# Patient Record
Sex: Male | Born: 1935 | Race: White | Hispanic: No | Smoking: Former smoker
Health system: Southern US, Community
[De-identification: ages and names within clinical notes are randomized; demographics above are authoritative.]

## PROBLEM LIST (undated history)

## (undated) DIAGNOSIS — R609 Edema, unspecified: Secondary | ICD-10-CM

## (undated) DIAGNOSIS — H9190 Unspecified hearing loss, unspecified ear: Secondary | ICD-10-CM

## (undated) DIAGNOSIS — J439 Emphysema, unspecified: Secondary | ICD-10-CM

## (undated) DIAGNOSIS — M1990 Unspecified osteoarthritis, unspecified site: Secondary | ICD-10-CM

## (undated) DIAGNOSIS — K219 Gastro-esophageal reflux disease without esophagitis: Secondary | ICD-10-CM

## (undated) DIAGNOSIS — R0600 Dyspnea, unspecified: Secondary | ICD-10-CM

## (undated) HISTORY — PX: CARDIAC CATHETERIZATION: SHX172

## (undated) HISTORY — PX: APPENDECTOMY: SHX54

## (undated) HISTORY — PX: CHOLECYSTECTOMY OPEN: SUR202

---

## 1999-06-09 ENCOUNTER — Inpatient Hospital Stay (HOSPITAL_COMMUNITY): Admission: EM | Admit: 1999-06-09 | Discharge: 1999-06-10 | Payer: Self-pay | Admitting: *Deleted

## 1999-10-05 ENCOUNTER — Ambulatory Visit (HOSPITAL_COMMUNITY): Admission: RE | Admit: 1999-10-05 | Discharge: 1999-10-05 | Payer: Self-pay | Admitting: Gastroenterology

## 2001-11-12 ENCOUNTER — Emergency Department (HOSPITAL_COMMUNITY): Admission: EM | Admit: 2001-11-12 | Discharge: 2001-11-12 | Payer: Self-pay | Admitting: *Deleted

## 2001-11-12 ENCOUNTER — Encounter: Payer: Self-pay | Admitting: *Deleted

## 2007-09-11 ENCOUNTER — Inpatient Hospital Stay (HOSPITAL_COMMUNITY): Admission: EM | Admit: 2007-09-11 | Discharge: 2007-09-12 | Payer: Self-pay | Admitting: Emergency Medicine

## 2007-09-11 ENCOUNTER — Ambulatory Visit: Payer: Self-pay | Admitting: Internal Medicine

## 2010-05-29 ENCOUNTER — Inpatient Hospital Stay (HOSPITAL_COMMUNITY): Admission: EM | Admit: 2010-05-29 | Discharge: 2010-05-31 | Payer: Self-pay | Admitting: Emergency Medicine

## 2010-05-31 ENCOUNTER — Encounter (INDEPENDENT_AMBULATORY_CARE_PROVIDER_SITE_OTHER): Payer: Self-pay

## 2010-12-08 LAB — DIFFERENTIAL
Basophils Absolute: 0 10*3/uL (ref 0.0–0.1)
Eosinophils Absolute: 0.1 10*3/uL (ref 0.0–0.7)
Eosinophils Relative: 1 % (ref 0–5)
Neutrophils Relative %: 86 % — ABNORMAL HIGH (ref 43–77)

## 2010-12-08 LAB — URINALYSIS, ROUTINE W REFLEX MICROSCOPIC
Bilirubin Urine: NEGATIVE
Hgb urine dipstick: NEGATIVE
Nitrite: NEGATIVE
Specific Gravity, Urine: 1.014 (ref 1.005–1.030)
pH: 7.5 (ref 5.0–8.0)

## 2010-12-08 LAB — COMPREHENSIVE METABOLIC PANEL
AST: 25 U/L (ref 0–37)
CO2: 25 mEq/L (ref 19–32)
Chloride: 108 mEq/L (ref 96–112)
Creatinine, Ser: 0.91 mg/dL (ref 0.4–1.5)
GFR calc Af Amer: >60 mL/min (ref >60)
GFR calc non Af Amer: >60 mL/min (ref >60)
Total Bilirubin: 0.9 mg/dL (ref 0.3–1.2)

## 2010-12-08 LAB — LIPASE, BLOOD: Lipase: 30 U/L (ref 11–59)

## 2010-12-08 LAB — POCT CARDIAC MARKERS
CKMB, poc: <1 ng/mL — ABNORMAL LOW (ref 1.0–8.0)
CKMB, poc: <1 ng/mL — ABNORMAL LOW (ref 1.0–8.0)
Myoglobin, poc: 83.7 ng/mL (ref 12–200)

## 2010-12-08 LAB — BASIC METABOLIC PANEL
BUN: 14 mg/dL (ref 6–23)
CO2: 26 mEq/L (ref 19–32)
Calcium: 8.3 mg/dL — ABNORMAL LOW (ref 8.4–10.5)
Chloride: 109 mEq/L (ref 96–112)
Creatinine, Ser: 0.89 mg/dL (ref 0.4–1.5)
GFR calc Af Amer: >60 mL/min (ref >60)

## 2010-12-08 LAB — CBC
HCT: 42.7 % (ref 39.0–52.0)
Hemoglobin: 14.3 g/dL (ref 13.0–17.0)
MCH: 29.8 pg (ref 26.0–34.0)
MCH: 29.8 pg (ref 26.0–34.0)
MCV: 88.3 fL (ref 78.0–100.0)
MCV: 89 fL (ref 78.0–100.0)
Platelets: 174 10*3/uL (ref 150–400)
RBC: 4.8 MIL/uL (ref 4.22–5.81)
RDW: 14.5 % (ref 11.5–15.5)
WBC: 11.5 10*3/uL — ABNORMAL HIGH (ref 4.0–10.5)

## 2010-12-08 LAB — HEPATIC FUNCTION PANEL
Indirect Bilirubin: 0.4 mg/dL (ref 0.3–0.9)
Total Protein: 6 g/dL (ref 6.0–8.3)

## 2011-02-10 NOTE — Discharge Summary (Signed)
NAME:  Jesse Beltran, Jesse Beltran               ACCOUNT NO.:  [id number]   MEDICAL RECORD NO.:  [id number]          PATIENT TYPE:  INP   LOCATION:  5511                         FACILITY:  [facility]   PHYSICIAN:  Alvester Morin, M.D.  DATE OF BIRTH:  1933/06/20   DATE OF ADMISSION:  09/11/2007  DATE OF DISCHARGE:  09/12/2007                               DISCHARGE SUMMARY   DISCHARGE DIAGNOSES:  1. Pneumonia.  2. Hypokalemia.   DISCHARGE MEDICATIONS:  Avelox 400 mg p.o. daily x 5 days.   PROCEDURES PERFORMED:  1. Chest x-ray September 11, 2007 impression:  Focal opacity left mid      lung.  The patient has symptoms of infection, this finding is in      keeping with pneumonia.  Follow up radiographs to clearing are      recommended.  If pneumonia is not within the clinical differential,      CT is recommended for further evaluation.  2. CT scan of the chest with contrast September 11, 2007 impression:      Patchy airspace disease with central air bronchograms in the left      upper lobe, lingula and left lower lobe anteriorly suspicious for      acute left bronchial pneumonia.  Recommend follow up by plain      radiographs to document resolution.  No definite adenopathy by CT.      Tiny bilateral pulmonary nodules in the upper lobes as described.      This could be followed at 6 months to document stability.   FOLLOW UP:  The patient was instructed to follow up with Dr. Burnell Blanks.  During the visit, the patient should be assessed for  resolution of pneumonia including a CBC to assess white count.  Physical  examination obviously to assess air movement and chest x-ray to  determine resolution of airspace disease.   CONSULTATIONS:  No consultations were requested during the  hospitalization.   HISTORY/PHYSICAL:  Mr. Jesse Beltran is a 77 year old Caucasian male with  history of GERD and asthma that presents to the emergency department for  chest pain since the night prior to admission.  The patient  was in his  normal state of health until the day prior to admission when he started  having sharp chest pain localized to his left chest.  The patient  described the pain as constant that was increased with deep inspiration  and increased when lying down.  The patient reports the pain does not  radiate anywhere.  The patient denies history of chest pain prior to  this.  He also denies fever, chills, nausea, vomiting, cough and edema.  The patient does have some sick contacts at his work place at [employer].  The patient does have a history of pneumococcal vaccine, but no  flu shot this year.   PHYSICAL EXAMINATION:  VITAL SIGNS:  Temperature 101.6, blood pressure  115/61, pulse 98, respirations 18, O2 sat 99% on 2 liters.  GENERAL:  The patient was lying in the stretcher at about a 45 degree  incline in no  acute distress.  HEENT:  Eyes:  Sclerae were anicteric.  Pupils are equal, round, and  reactive to light and accommodation.  Extraocular movements are intact.  Moist mucous membranes with no lesions and clear oropharynx.  NECK:  Supple.  Shotty cervical and supraclavicular lymphadenopathy  noted along with a right-sided bruit.  RESPIRATORY:  Clear to auscultation bilaterally with good air movement.  CARDIOVASCULAR:  Regular rate and rhythm.  The patient was slightly  tachypneic.  No murmurs, gallops or rubs.  GI:  Positive bowel sounds, soft, nontender and nondistended.  No  organomegaly noted.  EXTREMITIES:  No clubbing, cyanosis, or edema.  MUSCULOSKELETAL:  No painful joints.  NEURO:  Grossly nonfocal.  PSYCHIATRIC:  Appropriate.   LABORATORY DATA:  On admission:  Sodium 137, potassium 3.5, chloride  106, bicarb 20.9, BUN 11, creatinine 0.9, glucose 82, white count 8.8,  ANC 7.2, hemoglobin 13.7, MCV 89.6, platelets 220,000.  On day of  discharge:  Blood cultures were negative x 2.  Magnesium 2.3, sodium  139, potassium 3.7, chloride 108, bicarb 26, glucose 98, BUN 6,   creatinine 0.82, calcium 7.8, WBC 6.0, hemoglobin 13.4, platelets  186,000.   HOSPITAL COURSE:  1. Pneumonia.  The patient was admitted to the hospital, placed on      oxygen and given Zithromax and Rocephin.  He had blood cultures      drawn.  The patient was also admitted to telemetry.  Cardiac      enzymes were cycled and EKGs were repeated along with his D-dimer      all of which were within normal limits.  The patient was switched      the following day to p.o. Avelox and was to continue for another 5      days to complete a total of 7 days.  2. Hypokalemia.  The patient had a potassium of 3.0 initially which      was repleted.  Magnesium level was also within normal limits.  3. Gastroesophageal reflux disease.  The patient was continued on      Protonix.      Mariea Stable, MD  Electronically Signed      Alvester Morin, M.D.  Electronically Signed    MA/MEDQ  D:  09/28/2007  T:  09/28/2007  Job:  [id number]   cc:   Burnell Blanks, MD

## 2011-02-10 NOTE — Procedures (Signed)
[place]  Patient:    Jesse Beltran                       MRN: [id number] Proc. Date: 10/05/99 Adm. Date:  [id number] Attending:  Orland Mustard CC:         Kizzie Furnish, M.D.                           Procedure Report  PROCEDURE PERFORMED:  Esophagogastroduodenoscopy.  ENDOSCOPIST:  Llana Aliment. Randa Evens, M.D.  MEDICATIONS USED:  Hurricaine spray, fentanyl 50 mcg, Versed 5 mg IV.  INDICATIONS FOR PROCEDURE:  Jesse Beltran is a gentleman who has had esophageal reflux, hematemesis and was admitted to the hospital. Endoscopy showed severe ulcerative esophagitis.  Could not be sure it was not malignancy.  This was treated aggressively with Prevacid and antireflux measures.  Due to concern over this possibly malignant, repeat endoscopy was performed.  DESCRIPTION OF PROCEDURE:  The procedure had been explained to the patient and consent obtained.  With the patient in the left lateral decubitus position the Olympus video endoscope was inserted blindly and advanced under direct visualization.  The stomach was entered, the pylorus identified and passed. The duodenum including the bulb and second portion were seen well.  Scope was withdrawn back to the stomach.  The antrum and body were normal.  Fundus and cardia were een well on the retroflex view and were normal.  There was a hiatal hernia and the E junction was completely undetectable. It was patulous.  There was reddened esophagus, no ulceration, certainly no signs of cancer.  It is possible there could have been short segment Barretts.  It was 95% improved over previous endoscopy.  The proximal esophagus was seen well upon withdrawal and was normal.  The patient tolerated the procedure well and was maintained on low-flow oxygen and pulse oximeter throughout the procedure with no obvious problem.  ASSESSMENT: 1. Continued esophagitis, much improved over previous endoscopy.  I  am concerned    that this gentleman has no real detectable gastroesophageal junction and I    suspect reflux will continue to be a problem for him.  PLAN:  Will continue on Prevacid.  Will add Reglan 20 mg b.i.d. and will see back in the office in two months. DD:  10/05/99 TD:  10/05/99 Job: [id number]

## 2011-06-30 LAB — I-STAT 8, (EC8 V) (CONVERTED LAB)
Acid-base deficit: 1
Bicarbonate: 20.9
HCT: 41
Hemoglobin: 13.9
Operator id: 198171
Sodium: 137
TCO2: 22

## 2011-06-30 LAB — DIFFERENTIAL
Basophils Absolute: 0
Basophils Absolute: 0
Basophils Absolute: 0
Basophils Relative: 0
Basophils Relative: 0
Eosinophils Absolute: 0.2
Lymphocytes Relative: 9 — ABNORMAL LOW
Monocytes Absolute: 0.6
Monocytes Absolute: 0.7
Monocytes Relative: 12
Neutro Abs: 4.5
Neutro Abs: 4.9
Neutro Abs: 7.2
Neutrophils Relative %: 72
Neutrophils Relative %: 75

## 2011-06-30 LAB — BASIC METABOLIC PANEL
Calcium: 7.8 — ABNORMAL LOW
GFR calc Af Amer: >60
GFR calc non Af Amer: >60
Glucose, Bld: 98
Potassium: 3.7
Sodium: 139

## 2011-06-30 LAB — CBC
HCT: 37.8 — ABNORMAL LOW
HCT: 38.8 — ABNORMAL LOW
Hemoglobin: 12.8 — ABNORMAL LOW
Hemoglobin: 13.4
Hemoglobin: 13.7
MCHC: 33.8
Platelets: 210
RBC: 4.39
RDW: 13.5
RDW: 13.7
WBC: 6

## 2011-06-30 LAB — CULTURE, BLOOD (ROUTINE X 2): Culture: NO GROWTH

## 2011-06-30 LAB — COMPREHENSIVE METABOLIC PANEL
Alkaline Phosphatase: 58
BUN: 7
CO2: 23
GFR calc non Af Amer: >60
Glucose, Bld: 120 — ABNORMAL HIGH
Potassium: 3 — ABNORMAL LOW
Total Protein: 5 — ABNORMAL LOW

## 2011-06-30 LAB — POCT CARDIAC MARKERS: Troponin i, poc: <0.05

## 2011-06-30 LAB — D-DIMER, QUANTITATIVE: D-Dimer, Quant: 0.37

## 2014-02-05 ENCOUNTER — Emergency Department (HOSPITAL_COMMUNITY): Payer: Medicare Other

## 2014-02-05 ENCOUNTER — Encounter (HOSPITAL_COMMUNITY): Payer: Self-pay | Admitting: Emergency Medicine

## 2014-02-05 ENCOUNTER — Emergency Department (HOSPITAL_COMMUNITY)
Admission: EM | Admit: 2014-02-05 | Discharge: 2014-02-05 | Disposition: A | Discharge status: Home / Self Care | Payer: Medicare Other | Attending: Emergency Medicine | Admitting: Emergency Medicine

## 2014-02-05 DIAGNOSIS — Z9089 Acquired absence of other organs: Secondary | ICD-10-CM | POA: Insufficient documentation

## 2014-02-05 DIAGNOSIS — R112 Nausea with vomiting, unspecified: Secondary | ICD-10-CM | POA: Insufficient documentation

## 2014-02-05 DIAGNOSIS — R109 Unspecified abdominal pain: Secondary | ICD-10-CM

## 2014-02-05 DIAGNOSIS — R1032 Left lower quadrant pain: Secondary | ICD-10-CM | POA: Insufficient documentation

## 2014-02-05 LAB — I-STAT CHEM 8, ED
BUN: 13 mg/dL (ref 6–23)
CHLORIDE: 103 meq/L (ref 96–112)
CREATININE: 1.4 mg/dL — AB (ref 0.50–1.35)
Calcium, Ion: 0.95 mmol/L — ABNORMAL LOW (ref 1.13–1.30)
GLUCOSE: 138 mg/dL — AB (ref 70–99)
HCT: 48 % (ref 39.0–52.0)
Hemoglobin: 16.3 g/dL (ref 13.0–17.0)
POTASSIUM: 3.6 meq/L — AB (ref 3.7–5.3)
Sodium: 136 mEq/L — ABNORMAL LOW (ref 137–147)
TCO2: 20 mmol/L (ref 0–100)

## 2014-02-05 LAB — URINALYSIS, ROUTINE W REFLEX MICROSCOPIC
Bilirubin Urine: NEGATIVE
Glucose, UA: NEGATIVE mg/dL
KETONES UR: NEGATIVE mg/dL
LEUKOCYTES UA: NEGATIVE
NITRITE: NEGATIVE
PH: 6.5 (ref 5.0–8.0)
Protein, ur: NEGATIVE mg/dL
Urobilinogen, UA: 1 mg/dL (ref 0.0–1.0)

## 2014-02-05 LAB — CBC WITH DIFFERENTIAL/PLATELET
Basophils Absolute: 0 10*3/uL (ref 0.0–0.1)
Basophils Relative: 0 % (ref 0–1)
Eosinophils Absolute: 0.1 10*3/uL (ref 0.0–0.7)
Eosinophils Relative: 1 % (ref 0–5)
HEMATOCRIT: 45.1 % (ref 39.0–52.0)
HEMOGLOBIN: 15.5 g/dL (ref 13.0–17.0)
LYMPHS ABS: 0.9 10*3/uL (ref 0.7–4.0)
Lymphocytes Relative: 9 % — ABNORMAL LOW (ref 12–46)
MCH: 29.6 pg (ref 26.0–34.0)
MCHC: 34.4 g/dL (ref 30.0–36.0)
MCV: 86.2 fL (ref 78.0–100.0)
MONO ABS: 0.8 10*3/uL (ref 0.1–1.0)
MONOS PCT: 8 % (ref 3–12)
NEUTROS ABS: 7.8 10*3/uL — AB (ref 1.7–7.7)
NEUTROS PCT: 82 % — AB (ref 43–77)
Platelets: 180 10*3/uL (ref 150–400)
RBC: 5.23 MIL/uL (ref 4.22–5.81)
RDW: 13 % (ref 11.5–15.5)
WBC: 9.6 10*3/uL (ref 4.0–10.5)

## 2014-02-05 LAB — I-STAT CG4 LACTIC ACID, ED
LACTIC ACID, VENOUS: 3.69 mmol/L — AB (ref 0.5–2.2)
LACTIC ACID, VENOUS: 1.13 mmol/L (ref 0.5–2.2)

## 2014-02-05 LAB — URINE MICROSCOPIC-ADD ON

## 2014-02-05 LAB — I-STAT TROPONIN, ED: Troponin i, poc: 0 ng/mL (ref 0.00–0.08)

## 2014-02-05 MED ORDER — FENTANYL CITRATE 0.05 MG/ML IJ SOLN
75.0000 ug | Freq: Once | INTRAMUSCULAR | Status: AC
  Administered 2014-02-05: 75 ug via INTRAVENOUS
  Filled 2014-02-05: qty 2

## 2014-02-05 MED ORDER — IOHEXOL 350 MG/ML SOLN
100.0000 mL | Freq: Once | INTRAVENOUS | Status: AC | PRN
  Administered 2014-02-05: 100 mL via INTRAVENOUS

## 2014-02-05 MED ORDER — MORPHINE SULFATE 4 MG/ML IJ SOLN
4.0000 mg | Freq: Once | INTRAMUSCULAR | Status: AC
  Administered 2014-02-05: 4 mg via INTRAVENOUS
  Filled 2014-02-05: qty 1

## 2014-02-05 MED ORDER — MORPHINE SULFATE 4 MG/ML IJ SOLN
4.0000 mg | Freq: Once | INTRAMUSCULAR | Status: DC
Start: 2014-02-05 — End: 2014-02-05

## 2014-02-05 MED ORDER — ONDANSETRON HCL 4 MG/2ML IJ SOLN
4.0000 mg | Freq: Once | INTRAMUSCULAR | Status: AC
  Administered 2014-02-05: 4 mg via INTRAVENOUS
  Filled 2014-02-05: qty 2

## 2014-02-05 MED ORDER — SODIUM CHLORIDE 0.9 % IV BOLUS (SEPSIS)
1000.0000 mL | Freq: Once | INTRAVENOUS | Status: AC
  Administered 2014-02-05: 1000 mL via INTRAVENOUS

## 2014-02-05 NOTE — ED Notes (Signed)
Pt getting undressed; belongings in bag

## 2014-02-05 NOTE — ED Notes (Signed)
Lactic acid results given to Dr. Harrison 

## 2014-02-05 NOTE — ED Notes (Signed)
80 yo from home with Severe ABD pain starting at 0600. Pain is in Left Lower side of abd close to groin area. Last BM yest at 0830 stool was loose. N/V x3. Vitals Stable. HX of Gallbladder removal.

## 2014-02-05 NOTE — ED Notes (Signed)
Ct scan called to get pt.

## 2014-02-05 NOTE — ED Notes (Signed)
Pt passed fluid challenge. 

## 2014-02-05 NOTE — Discharge Instructions (Signed)
Your work-up today including blood work, CT scan, and urine tests were all negative.  At this time we do not know exactly what caused your pain. May use over the counter colace, miralax, dulcolax, or other stool softener if needed to regulate bowel movements. Follow up with your primary care physician. Return to the ED for new or worsening symptoms-- return of pain, Nausea, vomiting, diarrhea, high fever, chills, etc.

## 2014-02-05 NOTE — ED Notes (Signed)
Urinal given and asked to provide sample 

## 2014-02-05 NOTE — ED Notes (Signed)
Warm blanket given

## 2014-02-05 NOTE — ED Provider Notes (Signed)
Medical screening examination/treatment/procedure(s) were conducted as a shared visit with non-physician practitioner(s) and myself.  I personally evaluated the patient during the encounter.   EKG Interpretation None      I interviewed and examined the patient. Lungs are CTAB. Cardiac exam wnl. Abdomen soft w/ LLQ pain on arrival. Pt has enlarged left testicle which has been that way for several years per him. He denies any testicular pain. Will get labs/CT.  Lactate elevated, but other labs and CTA non-contrib and pt feeling much better after pain control. Repeat lactic acid is normal after 1L NS. Initial value possibly spurious as it was istat versus clearing w/ 1L IVF. Pt feeling much better and abd remains soft after repeat exams w/ only minimal ttp. Pt ambulatory and tolerating po. Will d/c home w/ return precautions.     Blanchard Kelch, MD 02/05/14 2015

## 2014-02-05 NOTE — ED Notes (Signed)
Patient transported to CT 

## 2014-02-05 NOTE — ED Notes (Signed)
Pt is still writhing in pain; states pain meds did not help; MD aware.

## 2014-02-05 NOTE — ED Provider Notes (Signed)
CSN: [id number]     Arrival date & time 02/05/14  1950 History   First MD Initiated Contact with Patient 02/05/14 [phone]     Chief Complaint  Patient presents with  . Abdominal Pain     (Consider location/radiation/quality/duration/timing/severity/associated sxs/prior Treatment) The history is provided by the patient and medical records.   This is a 80 y.o. M with no significant medical problems presenting to the ED for severe abdominal pain.  Pt states sudden onset of symptoms at 0600 and have progressively worsened.  Pain localized to LLQ/groin described as sharp and stabbing with some radiation to back.  Endorses associated nausea and non-bloody, non-bilious emesis x3.  Last PO intake was chicken pot pie last night, did have 1 episodes of loose stool afterwards, non-bloody.  Has not had a BM today-- tried but unable to produce one.  Has been able to pass flatus.  Denies fever, chills, flank pain, or urinary symptoms.  Prior abdominal surgeries include cholecystectomy and appendectomy.  No past medical history on file. Past Surgical History  Procedure Laterality Date  . Cholecystectomy open     No family history on file. History  Substance Use Topics  . Smoking status: Not on file  . Smokeless tobacco: Not on file  . Alcohol Use: Not on file    Review of Systems  Gastrointestinal: Positive for nausea, vomiting and abdominal pain.  All other systems reviewed and are negative.     Allergies  Review of patient's allergies indicates not on file.  Home Medications   Prior to Admission medications   Not on File   BP 135/63  Pulse 82  Resp 16  SpO2 99%  Physical Exam  Nursing note and vitals reviewed. Constitutional: He is oriented to person, place, and time. He appears well-developed and well-nourished. No distress.  Appears very uncomfortable, writhing in bed, holding LLQ/groin  HENT:  Head: Normocephalic and atraumatic.  Mouth/Throat: Oropharynx is clear and moist.    Eyes: Conjunctivae and EOM are normal. Pupils are equal, round, and reactive to light.  Neck: Normal range of motion. Neck supple.  Cardiovascular: Normal rate, regular rhythm, normal heart sounds, intact distal pulses and normal pulses.   DP pulses intact bilaterally  Pulmonary/Chest: Effort normal and breath sounds normal. No respiratory distress. He has no wheezes.  Abdominal: Soft. Bowel sounds are normal. There is tenderness in the left lower quadrant. There is guarding. There is no rigidity, no CVA tenderness, no tenderness at McBurney's point and negative Murphy's sign.    Genitourinary:  Left testicle significantly enlarged when compared with right (pt states is baseline); testicle non-tender  Musculoskeletal: Normal range of motion. He exhibits no edema.  Neurological: He is alert and oriented to person, place, and time.  Skin: Skin is warm and dry. He is not diaphoretic.  Psychiatric: He has a normal mood and affect.    ED Course  Procedures (including critical care time) Labs Review Labs Reviewed  CBC WITH DIFFERENTIAL - Abnormal; Notable for the following:    Neutrophils Relative % 82 (*)    Neutro Abs 7.8 (*)    Lymphocytes Relative 9 (*)    All other components within normal limits  I-STAT CHEM 8, ED - Abnormal; Notable for the following:    Sodium 136 (*)    Potassium 3.6 (*)    Creatinine, Ser 1.40 (*)    Glucose, Bld 138 (*)    Calcium, Ion 0.95 (*)    All other components within normal limits  I-STAT CG4 LACTIC ACID, ED - Abnormal; Notable for the following:    Lactic Acid, Venous 3.69 (*)    All other components within normal limits  URINALYSIS, ROUTINE W REFLEX MICROSCOPIC  I-STAT TROPOININ, ED  I-STAT CG4 LACTIC ACID, ED    Imaging Review Dg Chest Port 1 View  02/05/2014   CLINICAL DATA:  Chest and abdominal pain.  EXAM: PORTABLE CHEST - 1 VIEW  COMPARISON:  Chest x-ray 05/29/2010.  FINDINGS: The patient is in the lordotic position. Lung volumes are  normal. No consolidative airspace disease. No pleural effusions. No pneumothorax. No pulmonary nodule or mass noted. Mild scarring in the medial aspects of the upper lobes bilaterally, similar to prior examinations. Pulmonary vasculature and the cardiomediastinal silhouette are within normal limits.  IMPRESSION: No radiographic evidence of acute cardiopulmonary disease.   Electronically Signed   By: Vinnie Langton M.D.   On: 02/05/2014 09:36   Ct Angio Abd/pel W/ And/or W/o  02/05/2014   ADDENDUM REPORT: 02/05/2014 12:52  ADDENDUM: The patient does have a huge hydrocele of the scrotum without evidence of an inguinal hernia.   Electronically Signed   By: Aletta Edouard M.D.   On: 02/05/2014 12:52   02/05/2014   CLINICAL DATA:  Severe abdominal pain.  EXAM: CTA ABDOMEN AND PELVIS WITHOUT CONTRAST  TECHNIQUE: Multidetector CT imaging of the abdomen and pelvis was performed using the standard protocol during bolus administration of intravenous contrast. Multiplanar reconstructed images and MIPs were obtained and reviewed to evaluate the vascular anatomy.  CONTRAST:  166mL OMNIPAQUE IOHEXOL 350 MG/ML SOLN  COMPARISON:  CT ABD/PELVIS W CM dated 05/29/2010; TG:GYIRSWN (W) dated 12/19/2006  FINDINGS: The abdominal aorta shows normal caliber and no evidence of aneurysmal disease, dissection or rupture. Atherosclerosis of the distal aorta noted without significant stenosis. There is approximately 50- 70% narrowing of the proximal celiac axis in a configuration consistent with arcuate ligament compression. The superior mesenteric artery, bilateral single renal arteries and inferior mesenteric artery show normal patency. Bilateral iliac and common femoral arteries are widely patent and show no significant disease. There is some mild plaque at the origins of both common iliac arteries.  Nonvascular evaluation shows diffuse steatosis of the liver. The gallbladder has been removed. No biliary dilatation is seen. The pancreas  is mildly atrophic. The adrenal glands, kidneys and spleen are within normal limits.  There is some subjective potentially a mildly thickened small bowel at the level of the jejunum without evidence of obstruction, bowel perforation or abnormal fluid collection. This may indicate enteritis or some other type of inflammation of small bowel. Diffuse diverticulosis noted involving the descending sigmoid colon without evidence of diverticulitis. No obvious focal masses or enlarged lymph nodes are seen. The bladder appears unremarkable. Minimal degenerative disc disease is present at L5-S1.  Review of the MIP images confirms the above findings.  IMPRESSION: 1. No evidence of aortic aneurysm, rupture or dissection. 2. 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 3. Subjective possible mild thickening of the jejunum which may be consistent with enteritis. 4. Colonic diverticulosis without evidence of diverticulitis by CT.  Electronically Signed: By: Aletta Edouard M.D. On: 02/05/2014 11:53     EKG Interpretation None      MDM   Final diagnoses:  Abdominal pain  Nausea and vomiting   On arrival pt is greatly uncomfortable-- he is writhing in bed and guarding his LLQ/groin.  He does have asymmetry of his testicles which he says is unchanged from baseline-- has been told was just enlarged testicle by his PCP who is currently following problem.  Will obtain labs, u/a, ct angio abdomen to r/o dissection or other acute etiology.  IVFB, morphine, and zofran given. Will monitor closely.  Labs with elevated lactic acid 3.69.  No leukocytosis, no significant electrolyte imbalance.  CXR clear.  CT angio without evidence of dissection or other acute processes.  Pt does have large left hydrocele but no hernia.  After meds and fluids pt is now very comfortable, has been walking up and down hall to  bathroom without difficulty, states he feels fine now. U/a pending.  Will re-check lactic acid.  U/a without signs of infection, trace blood.  Lactic acid now WNL at 1.13.  On third evaluation, pt continues to remain comfortable, lying in bed watching television.  He denies any recurrence of symptoms.  No further nausea or vomiting.  He has tolerated 2 glasses of water without difficulty.  Pt has negative work-up and without clear etiology of his sx.  Patient states he feels well at this time and would like to go home.  Have offered pain meds/nausea meds however pt declined.  He states he did have trouble with BM this morning, recommended OTC stool softener if needed.  I have strongly encouraged him to FU with his PCP in the next few days for re-check.  Discussed plan with patient, he/she acknowledged understanding and agreed with plan of care.  Return precautions given for new or worsening symptoms.  Case discussed with Dr. Aline Brochure who personally evaluated pt and agrees with plan of care.  Larene Pickett, PA-C 02/05/14 1544

## 2015-03-19 IMAGING — CR DG CHEST 1V PORT
1 series · 1 of 1 positions shown · non-contrast
Comparison: Chest x-ray 05/29/2010.

CLINICAL DATA: Chest and abdominal pain.

EXAM:
PORTABLE CHEST - 1 VIEW

[AP]
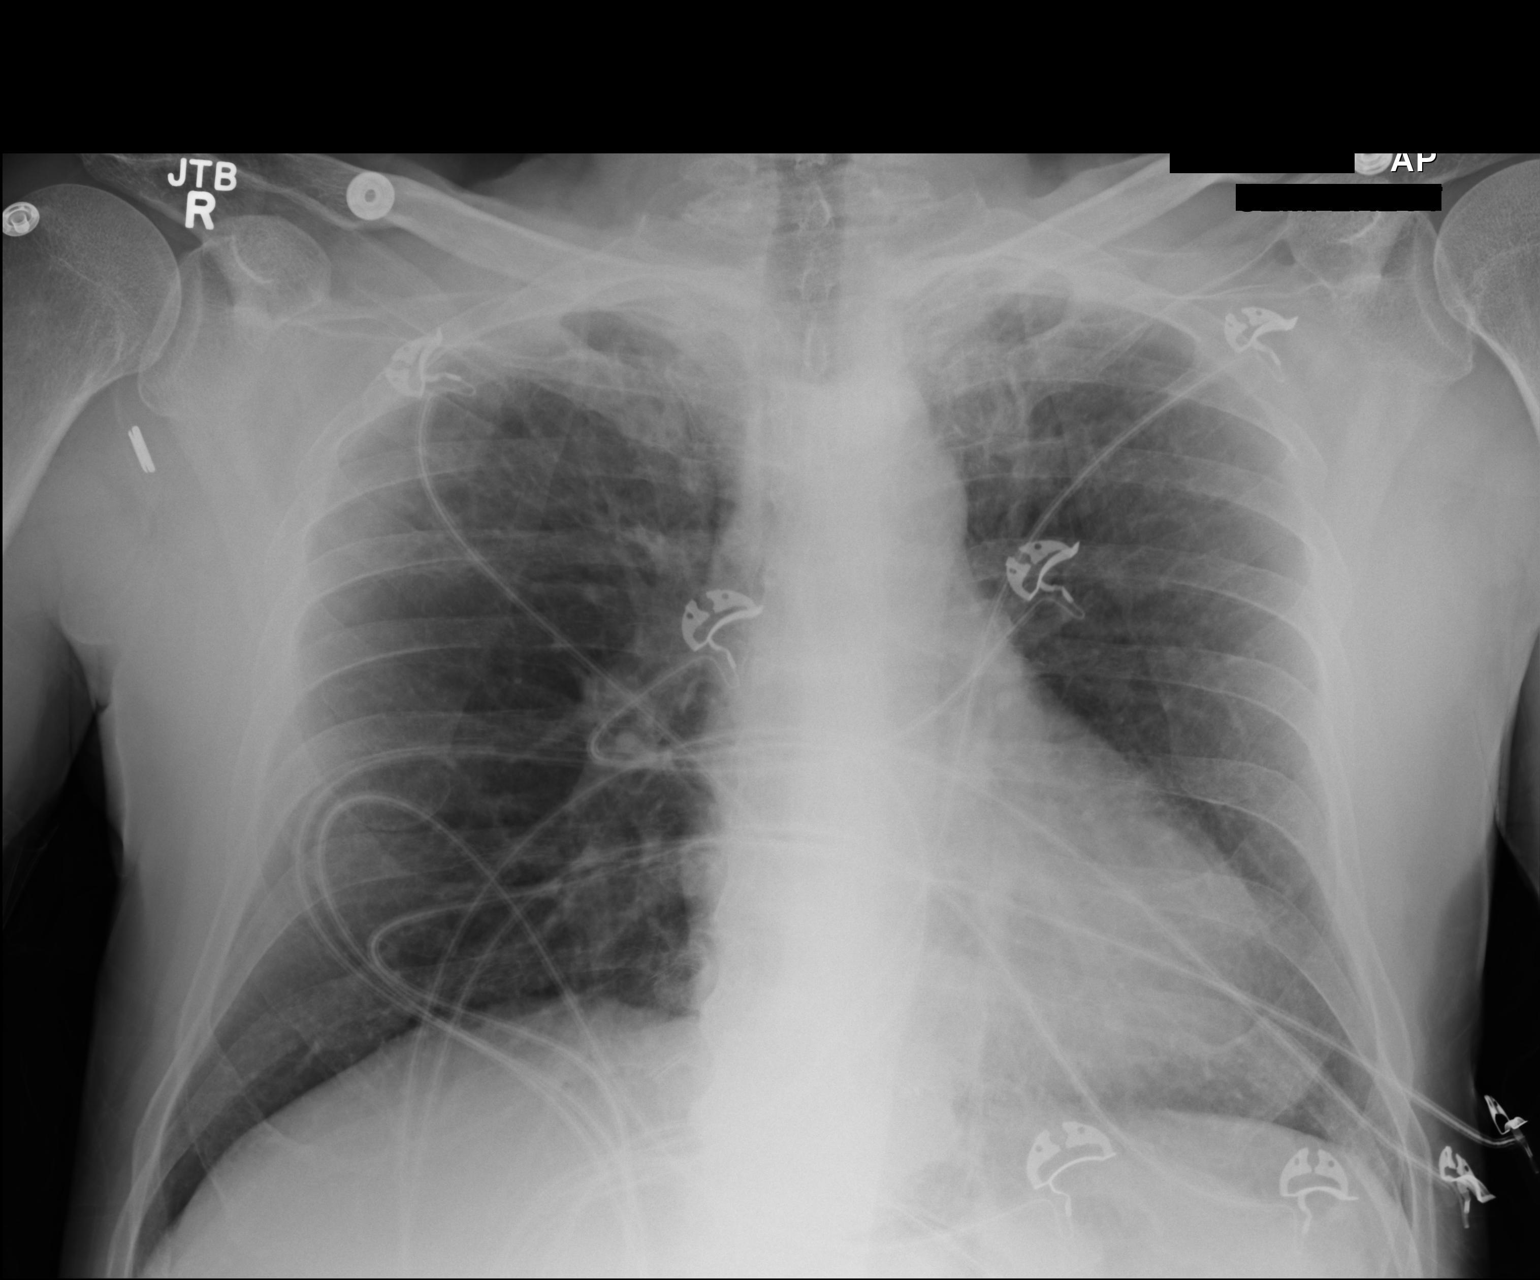

[1 of 1 positions shown; findings below may reference images not displayed]

FINDINGS: The patient is in the lordotic position. Lung volumes are normal. No
consolidative airspace disease. No pleural effusions. No
pneumothorax. No pulmonary nodule or mass noted. Mild scarring in
the medial aspects of the upper lobes bilaterally, similar to prior
examinations. Pulmonary vasculature and the cardiomediastinal
silhouette are within normal limits.
IMPRESSION: No radiographic evidence of acute cardiopulmonary disease.

## 2015-10-20 DIAGNOSIS — Z23 Encounter for immunization: Secondary | ICD-10-CM | POA: Diagnosis not present

## 2016-04-11 DIAGNOSIS — H353221 Exudative age-related macular degeneration, left eye, with active choroidal neovascularization: Secondary | ICD-10-CM | POA: Diagnosis not present

## 2016-04-11 DIAGNOSIS — H2513 Age-related nuclear cataract, bilateral: Secondary | ICD-10-CM | POA: Diagnosis not present

## 2016-05-17 DIAGNOSIS — H353221 Exudative age-related macular degeneration, left eye, with active choroidal neovascularization: Secondary | ICD-10-CM | POA: Diagnosis not present

## 2016-06-16 DIAGNOSIS — H353221 Exudative age-related macular degeneration, left eye, with active choroidal neovascularization: Secondary | ICD-10-CM | POA: Diagnosis not present

## 2016-07-21 DIAGNOSIS — H353221 Exudative age-related macular degeneration, left eye, with active choroidal neovascularization: Secondary | ICD-10-CM | POA: Diagnosis not present

## 2016-08-25 DIAGNOSIS — H353221 Exudative age-related macular degeneration, left eye, with active choroidal neovascularization: Secondary | ICD-10-CM | POA: Diagnosis not present

## 2016-10-02 DIAGNOSIS — H353221 Exudative age-related macular degeneration, left eye, with active choroidal neovascularization: Secondary | ICD-10-CM | POA: Diagnosis not present

## 2016-10-02 DIAGNOSIS — H2513 Age-related nuclear cataract, bilateral: Secondary | ICD-10-CM | POA: Diagnosis not present

## 2016-10-02 DIAGNOSIS — R600 Localized edema: Secondary | ICD-10-CM | POA: Diagnosis not present

## 2016-10-02 DIAGNOSIS — M25552 Pain in left hip: Secondary | ICD-10-CM | POA: Diagnosis not present

## 2016-10-02 DIAGNOSIS — I872 Venous insufficiency (chronic) (peripheral): Secondary | ICD-10-CM | POA: Diagnosis not present

## 2016-10-02 DIAGNOSIS — Z7689 Persons encountering health services in other specified circumstances: Secondary | ICD-10-CM | POA: Diagnosis not present

## 2016-10-04 DIAGNOSIS — H2513 Age-related nuclear cataract, bilateral: Secondary | ICD-10-CM | POA: Diagnosis not present

## 2016-10-10 ENCOUNTER — Encounter: Payer: Self-pay | Admitting: *Deleted

## 2016-10-24 MED ORDER — FENTANYL CITRATE (PF) 100 MCG/2ML IJ SOLN
INTRAMUSCULAR | Status: AC
  Filled 2016-10-24: qty 2

## 2016-10-26 ENCOUNTER — Ambulatory Visit: Payer: PPO | Admitting: Anesthesiology

## 2016-10-26 ENCOUNTER — Encounter
Admission: RE | Disposition: A | Discharge status: Home / Self Care | Payer: Self-pay | Source: Ambulatory Visit | Attending: Ophthalmology

## 2016-10-26 ENCOUNTER — Ambulatory Visit
Admission: RE | Admit: 2016-10-26 | Discharge: 2016-10-26 | Disposition: A | Discharge status: Home / Self Care | Payer: PPO | Source: Ambulatory Visit | Attending: Ophthalmology | Admitting: Ophthalmology

## 2016-10-26 ENCOUNTER — Encounter: Payer: Self-pay | Admitting: Anesthesiology

## 2016-10-26 DIAGNOSIS — M1990 Unspecified osteoarthritis, unspecified site: Secondary | ICD-10-CM | POA: Insufficient documentation

## 2016-10-26 DIAGNOSIS — J449 Chronic obstructive pulmonary disease, unspecified: Secondary | ICD-10-CM | POA: Insufficient documentation

## 2016-10-26 DIAGNOSIS — H2513 Age-related nuclear cataract, bilateral: Secondary | ICD-10-CM | POA: Diagnosis not present

## 2016-10-26 DIAGNOSIS — H269 Unspecified cataract: Secondary | ICD-10-CM | POA: Diagnosis not present

## 2016-10-26 DIAGNOSIS — H2511 Age-related nuclear cataract, right eye: Secondary | ICD-10-CM | POA: Insufficient documentation

## 2016-10-26 DIAGNOSIS — Z87891 Personal history of nicotine dependence: Secondary | ICD-10-CM | POA: Insufficient documentation

## 2016-10-26 HISTORY — DX: Edema, unspecified: R60.9

## 2016-10-26 HISTORY — DX: Unspecified hearing loss, unspecified ear: H91.90

## 2016-10-26 HISTORY — DX: Dyspnea, unspecified: R06.00

## 2016-10-26 HISTORY — DX: Emphysema, unspecified: J43.9

## 2016-10-26 HISTORY — DX: Unspecified osteoarthritis, unspecified site: M19.90

## 2016-10-26 HISTORY — DX: Gastro-esophageal reflux disease without esophagitis: K21.9

## 2016-10-26 HISTORY — PX: CATARACT EXTRACTION W/PHACO: SHX586

## 2016-10-26 SURGERY — PHACOEMULSIFICATION, CATARACT, WITH IOL INSERTION
Anesthesia: Monitor Anesthesia Care | Site: Eye | Laterality: Right | Wound class: Clean

## 2016-10-26 MED ORDER — LIDOCAINE HCL (PF) 4 % IJ SOLN
INTRAMUSCULAR | Status: DC | PRN
  Administered 2016-10-26: 4 mL via OPHTHALMIC

## 2016-10-26 MED ORDER — POVIDONE-IODINE 5 % OP SOLN
OPHTHALMIC | Status: AC
  Filled 2016-10-26: qty 30

## 2016-10-26 MED ORDER — ARMC OPHTHALMIC DILATING DROPS: 1.0000 "application " | OPHTHALMIC | Status: AC

## 2016-10-26 MED ORDER — EPINEPHRINE PF 1 MG/ML IJ SOLN
INTRAOCULAR | Status: DC | PRN
  Administered 2016-10-26: 200 mL via OPHTHALMIC

## 2016-10-26 MED ORDER — MOXIFLOXACIN HCL 0.5 % OP SOLN: 1.0000 [drp] | OPHTHALMIC | Status: DC | PRN

## 2016-10-26 MED ORDER — SODIUM HYALURONATE 23 MG/ML IO SOLN
INTRAOCULAR | Status: AC
Start: 2016-10-26 — End: 2016-10-26
  Filled 2016-10-26: qty 0.6

## 2016-10-26 MED ORDER — ARMC OPHTHALMIC DILATING DROPS
OPHTHALMIC | Status: AC
  Administered 2016-10-26: 1 via OPHTHALMIC
  Filled 2016-10-26: qty 0.4

## 2016-10-26 MED ORDER — MOXIFLOXACIN HCL 0.5 % OP SOLN
OPHTHALMIC | Status: DC | PRN
  Administered 2016-10-26: 0.2 mL via OPHTHALMIC

## 2016-10-26 MED ORDER — EPINEPHRINE PF 1 MG/ML IJ SOLN
INTRAMUSCULAR | Status: AC
  Filled 2016-10-26: qty 2

## 2016-10-26 MED ORDER — FENTANYL CITRATE (PF) 100 MCG/2ML IJ SOLN
INTRAMUSCULAR | Status: AC
  Filled 2016-10-26: qty 2

## 2016-10-26 MED ORDER — POVIDONE-IODINE 5 % OP SOLN
OPHTHALMIC | Status: DC | PRN
  Administered 2016-10-26: 1 via OPHTHALMIC

## 2016-10-26 MED ORDER — MOXIFLOXACIN HCL 0.5 % OP SOLN
OPHTHALMIC | Status: AC
  Filled 2016-10-26: qty 3

## 2016-10-26 MED ORDER — SODIUM HYALURONATE 23 MG/ML IO SOLN
INTRAOCULAR | Status: DC | PRN
  Administered 2016-10-26: 0.6 mL via INTRAOCULAR

## 2016-10-26 MED ORDER — SODIUM CHLORIDE 0.9 % IV SOLN
INTRAVENOUS | Status: DC
  Administered 2016-10-26 (×2): via INTRAVENOUS

## 2016-10-26 MED ORDER — LIDOCAINE HCL (PF) 4 % IJ SOLN
INTRAMUSCULAR | Status: AC
  Filled 2016-10-26: qty 5

## 2016-10-26 MED ORDER — ARMC OPHTHALMIC DILATING DROPS
1.0000 "application " | OPHTHALMIC | Status: DC
  Administered 2016-10-26 (×3): 1 via OPHTHALMIC

## 2016-10-26 MED ORDER — FENTANYL CITRATE (PF) 100 MCG/2ML IJ SOLN
INTRAMUSCULAR | Status: DC | PRN
  Administered 2016-10-26: 50 ug via INTRAVENOUS

## 2016-10-26 MED ORDER — SODIUM HYALURONATE 10 MG/ML IO SOLN
INTRAOCULAR | Status: DC | PRN
  Administered 2016-10-26: 0.55 mL via INTRAOCULAR

## 2016-10-26 MED ORDER — SODIUM HYALURONATE 10 MG/ML IO SOLN
INTRAOCULAR | Status: AC
  Filled 2016-10-26: qty 0.85

## 2016-10-26 SURGICAL SUPPLY — 21 items
CANNULA ANT/CHMB 27G (MISCELLANEOUS) ×2 IMPLANT
CANNULA ANT/CHMB 27GA (MISCELLANEOUS) ×6 IMPLANT
CUP MEDICINE 2OZ PLAST GRAD ST (MISCELLANEOUS) ×3 IMPLANT
DISSECTOR HYDRO NUCLEUS 50X22 (MISCELLANEOUS) ×3 IMPLANT
GLOVE BIO SURGEON STRL SZ8 (GLOVE) ×3 IMPLANT
GLOVE BIOGEL M 6.5 STRL (GLOVE) ×3 IMPLANT
GLOVE SURG LX 7.5 STRW (GLOVE) ×2
GLOVE SURG LX STRL 7.5 STRW (GLOVE) ×1 IMPLANT
GOWN STRL REUS W/ TWL LRG LVL3 (GOWN DISPOSABLE) ×2 IMPLANT
GOWN STRL REUS W/TWL LRG LVL3 (GOWN DISPOSABLE) ×6
LENS IOL TECNIS ITEC 24.0 (Intraocular Lens) ×2 IMPLANT
PACK CATARACT (MISCELLANEOUS) ×3 IMPLANT
PACK CATARACT KING (MISCELLANEOUS) ×3 IMPLANT
PACK EYE AFTER SURG (MISCELLANEOUS) ×3 IMPLANT
SOL BSS BAG (MISCELLANEOUS) ×3
SOLUTION BSS BAG (MISCELLANEOUS) ×1 IMPLANT
SYR 3ML LL SCALE MARK (SYRINGE) ×6 IMPLANT
SYR 5ML LL (SYRINGE) ×3 IMPLANT
SYR TB 1ML 27GX1/2 LL (SYRINGE) ×3 IMPLANT
WATER STERILE IRR 250ML POUR (IV SOLUTION) ×3 IMPLANT
WIPE NON LINTING 3.25X3.25 (MISCELLANEOUS) ×3 IMPLANT

## 2016-10-26 NOTE — H&P (Signed)
The History and Physical notes are on paper, have been signed, and are to be scanned. The patient remains stable and unchanged from the H&P.   Previous H&P reviewed, patient examined, and there are no changes.  Jesse Beltran 10/26/2016 9:40 AM

## 2016-10-26 NOTE — Discharge Instructions (Signed)
Eye Surgery Discharge Instructions  Expect mild scratchy sensation or mild soreness. DO NOT RUB YOUR EYE!  The day of surgery:  Minimal physical activity, but bed rest is not required  No reading, computer work, or close hand work  No bending, lifting, or straining.  May watch TV  For 24 hours:  No driving, legal decisions, or alcoholic beverages  Safety precautions  Eat anything you prefer: It is better to start with liquids, then soup then solid foods.  _____ Eye patch should be worn until postoperative exam tomorrow.  ____ Solar shield eyeglasses should be worn for comfort in the sunlight/patch while sleeping  Resume all regular medications including aspirin or Coumadin if these were discontinued prior to surgery. You may shower, bathe, shave, or wash your hair. Tylenol may be taken for mild discomfort.  Call your doctor if you experience significant pain, nausea, or vomiting, fever > 101 or other signs of infection. [phone] or [phone] Specific instructions:  Follow-up Information    Benay Pillow, MD Follow up.   Specialty:  Ophthalmology Why:  February 2 at 8:40am Contact information: [address] [place] [address] [phone]

## 2016-10-26 NOTE — Anesthesia Preprocedure Evaluation (Addendum)
Anesthesia Evaluation  Patient identified by MRN, date of birth, ID band Patient awake    Reviewed: Allergy & Precautions, NPO status , Patient's Chart, lab work & pertinent test results, reviewed documented beta blocker date and time   Airway Mallampati: II  TM Distance: >3 FB     Dental  (+) Chipped, Upper Dentures, Lower Dentures   Pulmonary shortness of breath, COPD, former smoker,           Cardiovascular      Neuro/Psych    GI/Hepatic   Endo/Other    Renal/GU      Musculoskeletal  (+) Arthritis ,   Abdominal   Peds  Hematology   Anesthesia Other Findings   Reproductive/Obstetrics                            Anesthesia Physical Anesthesia Plan  ASA: III  Anesthesia Plan: MAC   Post-op Pain Management:    Induction:   Airway Management Planned:   Additional Equipment:   Intra-op Plan:   Post-operative Plan:   Informed Consent: I have reviewed the patients History and Physical, chart, labs and discussed the procedure including the risks, benefits and alternatives for the proposed anesthesia with the patient or authorized representative who has indicated his/her understanding and acceptance.     Plan Discussed with: CRNA  Anesthesia Plan Comments:         Anesthesia Quick Evaluation

## 2016-10-26 NOTE — Anesthesia Procedure Notes (Signed)
Procedure Name: MAC Date/Time: 10/26/2016 9:45 AM Performed by: Allean Found Pre-anesthesia Checklist: Patient identified, Emergency Drugs available, Suction available, Timeout performed and Patient being monitored Oxygen Delivery Method: Nasal cannula

## 2016-10-26 NOTE — Op Note (Signed)
OPERATIVE NOTE  Jesse Beltran UK:[id number] 10/26/2016   PREOPERATIVE DIAGNOSIS:  Nuclear sclerotic cataract right eye.  H25.11   POSTOPERATIVE DIAGNOSIS:    Nuclear sclerotic cataract right eye.     PROCEDURE:  Phacoemusification with posterior chamber intraocular lens placement of the right eye   LENS:   Implant Name Type Inv. Item Serial No. Manufacturer Lot No. LRB No. Used  LENS IOL DIOP 24.0 - [id number] Intraocular Lens LENS IOL DIOP 24.0 [id number] AMO   Right 1       PCB00 +24.0   ULTRASOUND TIME: 0 minutes 40 seconds.  CDE 4.64   SURGEON:  Benay Pillow, MD, MPH  ANESTHESIOLOGIST: No anesthesia staff entered.   ANESTHESIA:  Topical with tetracaine drops augmented with 1% preservative-free intracameral lidocaine.  ESTIMATED BLOOD LOSS: less than 1 mL.   COMPLICATIONS:  None.   DESCRIPTION OF PROCEDURE:  The patient was identified in the holding room and transported to the operating room and placed in the supine position under the operating microscope.  The right eye was identified as the operative eye and it was prepped and draped in the usual sterile ophthalmic fashion.   A 1.0 millimeter clear-corneal paracentesis was made at the 10:30 position. 0.5 ml of preservative-free 1% lidocaine with epinephrine was injected into the anterior chamber.  The anterior chamber was filled with Healon 5 viscoelastic.  A 2.4 millimeter keratome was used to make a near-clear corneal incision at the 8:00 position.  A curvilinear capsulorrhexis was made with a cystotome and capsulorrhexis forceps.  Balanced salt solution was used to hydrodissect and hydrodelineate the nucleus.   Phacoemulsification was then used in stop and chop fashion to remove the lens nucleus and epinucleus.  The remaining cortex was then removed using the irrigation and aspiration handpiece. Healon was then placed into the capsular bag to distend it for lens placement.  A lens was then injected into the capsular bag.   The remaining viscoelastic was aspirated.   Wounds were hydrated with balanced salt solution.  The anterior chamber was inflated to a physiologic pressure with balanced salt solution.   Intracameral vigamox 0.1 mL undiluted was injected into the eye and a drop placed onto the ocular surface.  No wound leaks were noted.  The patient was taken to the recovery room in stable condition without complications of anesthesia or surgery  Benay Pillow 10/26/2016, 10:10 AM

## 2016-10-26 NOTE — Transfer of Care (Signed)
Immediate Anesthesia Transfer of Care Note  Patient: Jesse Beltran  Procedure(s) Performed: Procedure(s) with comments: CATARACT EXTRACTION PHACO AND INTRAOCULAR LENS PLACEMENT (IOC) (Right) - [place] 40.4 ap% 11.5 cde 4.64  lot# [id number]  Patient Location: PACU  Anesthesia Type:MAC  Level of Consciousness: awake  Airway & Oxygen Therapy: Patient Spontanous Breathing  Post-op Assessment: Report given to RN and Post -op Vital signs reviewed and stable  Post vital signs: Reviewed and stable  Last Vitals:  Vitals:   10/26/16 0823  BP: (!) 153/74  Pulse: 78  Resp: 16  Temp: 36.5 C    Last Pain:  Vitals:   10/26/16 0823  TempSrc: Oral         Complications: No apparent anesthesia complications

## 2016-10-26 NOTE — Anesthesia Postprocedure Evaluation (Signed)
Anesthesia Post Note  Patient: Jesse Beltran  Procedure(s) Performed: Procedure(s) (LRB): CATARACT EXTRACTION PHACO AND INTRAOCULAR LENS PLACEMENT (IOC) (Right)  Patient location during evaluation: Short Stay Anesthesia Type: MAC Level of consciousness: awake Pain management: pain level controlled Vital Signs Assessment: post-procedure vital signs reviewed and stable Respiratory status: spontaneous breathing Cardiovascular status: blood pressure returned to baseline Postop Assessment: no headache Anesthetic complications: no     Last Vitals:  Vitals:   10/26/16 0823  BP: (!) 153/74  Pulse: 78  Resp: 16  Temp: 36.5 C    Last Pain:  Vitals:   10/26/16 0823  TempSrc: Oral                 Buckner Malta

## 2016-10-26 NOTE — Anesthesia Post-op Follow-up Note (Cosign Needed)
Anesthesia QCDR form completed.        

## 2016-10-27 ENCOUNTER — Encounter: Payer: Self-pay | Admitting: Ophthalmology

## 2016-11-13 DIAGNOSIS — H353221 Exudative age-related macular degeneration, left eye, with active choroidal neovascularization: Secondary | ICD-10-CM | POA: Diagnosis not present

## 2016-12-20 DIAGNOSIS — H2512 Age-related nuclear cataract, left eye: Secondary | ICD-10-CM | POA: Diagnosis not present

## 2017-01-01 ENCOUNTER — Encounter: Payer: Self-pay | Admitting: *Deleted

## 2017-01-03 MED ORDER — MOXIFLOXACIN HCL 0.5 % OP SOLN: 1.0000 [drp] | OPHTHALMIC | Status: DC | PRN

## 2017-01-03 MED ORDER — ARMC OPHTHALMIC DILATING DROPS
1.0000 "application " | OPHTHALMIC | Status: AC
  Administered 2017-01-04 (×3): 1 via OPHTHALMIC

## 2017-01-04 ENCOUNTER — Encounter
Admission: RE | Disposition: A | Discharge status: Home / Self Care | Payer: Self-pay | Source: Ambulatory Visit | Attending: Ophthalmology

## 2017-01-04 ENCOUNTER — Encounter: Payer: Self-pay | Admitting: *Deleted

## 2017-01-04 ENCOUNTER — Ambulatory Visit: Payer: PPO | Admitting: Anesthesiology

## 2017-01-04 ENCOUNTER — Ambulatory Visit
Admission: RE | Admit: 2017-01-04 | Discharge: 2017-01-04 | Disposition: A | Discharge status: Home / Self Care | Payer: PPO | Source: Ambulatory Visit | Attending: Ophthalmology | Admitting: Ophthalmology

## 2017-01-04 DIAGNOSIS — H2512 Age-related nuclear cataract, left eye: Secondary | ICD-10-CM | POA: Insufficient documentation

## 2017-01-04 DIAGNOSIS — Z87891 Personal history of nicotine dependence: Secondary | ICD-10-CM | POA: Insufficient documentation

## 2017-01-04 DIAGNOSIS — J449 Chronic obstructive pulmonary disease, unspecified: Secondary | ICD-10-CM | POA: Insufficient documentation

## 2017-01-04 DIAGNOSIS — K219 Gastro-esophageal reflux disease without esophagitis: Secondary | ICD-10-CM | POA: Diagnosis not present

## 2017-01-04 DIAGNOSIS — H269 Unspecified cataract: Secondary | ICD-10-CM | POA: Diagnosis not present

## 2017-01-04 DIAGNOSIS — Z79899 Other long term (current) drug therapy: Secondary | ICD-10-CM | POA: Insufficient documentation

## 2017-01-04 HISTORY — PX: CATARACT EXTRACTION W/PHACO: SHX586

## 2017-01-04 SURGERY — PHACOEMULSIFICATION, CATARACT, WITH IOL INSERTION
Anesthesia: Monitor Anesthesia Care | Site: Eye | Laterality: Left | Wound class: Clean

## 2017-01-04 MED ORDER — FENTANYL CITRATE (PF) 100 MCG/2ML IJ SOLN: 25.0000 ug | INTRAMUSCULAR | Status: DC | PRN

## 2017-01-04 MED ORDER — FENTANYL CITRATE (PF) 100 MCG/2ML IJ SOLN
INTRAMUSCULAR | Status: DC | PRN
  Administered 2017-01-04 (×2): 25 ug via INTRAVENOUS

## 2017-01-04 MED ORDER — ONDANSETRON HCL 4 MG/2ML IJ SOLN
4.0000 mg | Freq: Once | INTRAMUSCULAR | Status: DC | PRN
Start: 2017-01-04 — End: 2017-01-04

## 2017-01-04 MED ORDER — POVIDONE-IODINE 5 % OP SOLN
OPHTHALMIC | Status: AC
  Filled 2017-01-04: qty 30

## 2017-01-04 MED ORDER — SODIUM HYALURONATE 10 MG/ML IO SOLN
INTRAOCULAR | Status: DC | PRN
  Administered 2017-01-04: 0.55 mL via INTRAOCULAR

## 2017-01-04 MED ORDER — ARMC OPHTHALMIC DILATING DROPS
OPHTHALMIC | Status: AC
  Administered 2017-01-04: 1 via OPHTHALMIC
  Filled 2017-01-04: qty 0.4

## 2017-01-04 MED ORDER — SODIUM HYALURONATE 23 MG/ML IO SOLN
INTRAOCULAR | Status: AC
  Filled 2017-01-04: qty 0.6

## 2017-01-04 MED ORDER — SODIUM HYALURONATE 23 MG/ML IO SOLN
INTRAOCULAR | Status: DC | PRN
  Administered 2017-01-04: 0.6 mL via INTRAOCULAR

## 2017-01-04 MED ORDER — MIDAZOLAM HCL 2 MG/2ML IJ SOLN
INTRAMUSCULAR | Status: DC | PRN
  Administered 2017-01-04: 0.5 mg via INTRAVENOUS

## 2017-01-04 MED ORDER — POVIDONE-IODINE 5 % OP SOLN
OPHTHALMIC | Status: DC | PRN
  Administered 2017-01-04: 1 via OPHTHALMIC

## 2017-01-04 MED ORDER — FENTANYL CITRATE (PF) 100 MCG/2ML IJ SOLN
INTRAMUSCULAR | Status: AC
  Filled 2017-01-04: qty 2

## 2017-01-04 MED ORDER — MOXIFLOXACIN HCL 0.5 % OP SOLN
OPHTHALMIC | Status: DC | PRN
  Administered 2017-01-04: 1 [drp] via OPHTHALMIC

## 2017-01-04 MED ORDER — MOXIFLOXACIN HCL 0.5 % OP SOLN
OPHTHALMIC | Status: AC
  Filled 2017-01-04: qty 3

## 2017-01-04 MED ORDER — EPINEPHRINE PF 1 MG/ML IJ SOLN
INTRAOCULAR | Status: DC | PRN
  Administered 2017-01-04: 200 mL via OPHTHALMIC

## 2017-01-04 MED ORDER — LIDOCAINE HCL (PF) 2 % IJ SOLN
INTRAMUSCULAR | Status: AC
  Filled 2017-01-04: qty 2

## 2017-01-04 MED ORDER — SODIUM HYALURONATE 10 MG/ML IO SOLN
INTRAOCULAR | Status: AC
  Filled 2017-01-04: qty 0.85

## 2017-01-04 MED ORDER — BSS IO SOLN
INTRAOCULAR | Status: DC | PRN
  Administered 2017-01-04: 4 mL via OPHTHALMIC

## 2017-01-04 MED ORDER — MIDAZOLAM HCL 2 MG/2ML IJ SOLN
INTRAMUSCULAR | Status: AC
  Filled 2017-01-04: qty 2

## 2017-01-04 MED ORDER — SODIUM CHLORIDE 0.9 % IV SOLN
INTRAVENOUS | Status: DC
  Administered 2017-01-04: 10:00:00 via INTRAVENOUS

## 2017-01-04 MED ORDER — EPINEPHRINE PF 1 MG/ML IJ SOLN
INTRAMUSCULAR | Status: AC
  Filled 2017-01-04: qty 2

## 2017-01-04 SURGICAL SUPPLY — 21 items
CANNULA ANT/CHMB 27G (MISCELLANEOUS) ×2 IMPLANT
CANNULA ANT/CHMB 27GA (MISCELLANEOUS) ×6 IMPLANT
CUP MEDICINE 2OZ PLAST GRAD ST (MISCELLANEOUS) ×3 IMPLANT
DISSECTOR HYDRO NUCLEUS 50X22 (MISCELLANEOUS) ×3 IMPLANT
GLOVE BIO SURGEON STRL SZ8 (GLOVE) ×3 IMPLANT
GLOVE BIOGEL M 6.5 STRL (GLOVE) ×3 IMPLANT
GLOVE SURG LX 7.5 STRW (GLOVE) ×2
GLOVE SURG LX STRL 7.5 STRW (GLOVE) ×1 IMPLANT
GOWN STRL REUS W/ TWL LRG LVL3 (GOWN DISPOSABLE) ×2 IMPLANT
GOWN STRL REUS W/TWL LRG LVL3 (GOWN DISPOSABLE) ×6
LENS IOL TECNIS ITEC 24.0 (Intraocular Lens) ×2 IMPLANT
PACK CATARACT (MISCELLANEOUS) ×3 IMPLANT
PACK CATARACT KING (MISCELLANEOUS) ×3 IMPLANT
PACK EYE AFTER SURG (MISCELLANEOUS) ×3 IMPLANT
SOL BSS BAG (MISCELLANEOUS) ×3
SOLUTION BSS BAG (MISCELLANEOUS) ×1 IMPLANT
SYR 3ML LL SCALE MARK (SYRINGE) ×6 IMPLANT
SYR 5ML LL (SYRINGE) ×3 IMPLANT
SYR TB 1ML 27GX1/2 LL (SYRINGE) ×3 IMPLANT
WATER STERILE IRR 250ML POUR (IV SOLUTION) ×3 IMPLANT
WIPE NON LINTING 3.25X3.25 (MISCELLANEOUS) ×3 IMPLANT

## 2017-01-04 NOTE — OR Nursing (Signed)
Vigamox to OR with patient IV right hand; p ox right mid finger To OR with ecg leads/shoe covers in place

## 2017-01-04 NOTE — Anesthesia Post-op Follow-up Note (Cosign Needed)
Anesthesia QCDR form completed.        

## 2017-01-04 NOTE — H&P (Signed)
The History and Physical notes are on paper, have been signed, and are to be scanned.   I have examined the patient and there are no changes to the H&P.   [name] 01/04/2017 10:31 AM   

## 2017-01-04 NOTE — Op Note (Signed)
OPERATIVE NOTE  Jesse Beltran [id number] 01/04/2017   PREOPERATIVE DIAGNOSIS:  Nuclear sclerotic cataract left eye.  H25.12   POSTOPERATIVE DIAGNOSIS:    Nuclear sclerotic cataract left eye.     PROCEDURE:  Phacoemusification with posterior chamber intraocular lens placement of the left eye   LENS:   Implant Name Type Inv. Item Serial No. Manufacturer Lot No. LRB No. Used  LENS IOL DIOP 24.0 - [id number] Intraocular Lens LENS IOL DIOP 24.0 [id number] AMO   Left 1       PCB00 +24.0   ULTRASOUND TIME: 0 minutes 47.1 seconds.  CDE 3.07   SURGEON:  Benay Pillow, MD, MPH   ANESTHESIA:  Topical with tetracaine drops augmented with 1% preservative-free intracameral lidocaine.  ESTIMATED BLOOD LOSS: <1 mL   COMPLICATIONS:  None.   DESCRIPTION OF PROCEDURE:  The patient was identified in the holding room and transported to the operating room and placed in the supine position under the operating microscope.  The left eye was identified as the operative eye and it was prepped and draped in the usual sterile ophthalmic fashion.   A 1.0 millimeter clear-corneal paracentesis was made at the 5:00 position. 0.5 ml of preservative-free 1% lidocaine with epinephrine was injected into the anterior chamber.  The anterior chamber was filled with Healon 5 viscoelastic.  A 2.4 millimeter keratome was used to make a near-clear corneal incision at the 2:00 position.  A curvilinear capsulorrhexis was made with a cystotome and capsulorrhexis forceps.  Balanced salt solution was used to hydrodissect and hydrodelineate the nucleus.   Phacoemulsification was then used in stop and chop fashion to remove the lens nucleus and epinucleus.  The remaining cortex was then removed using the irrigation and aspiration handpiece. Healon was then placed into the capsular bag to distend it for lens placement.  A lens was then injected into the capsular bag.  The remaining viscoelastic was aspirated.   Wounds were  hydrated with balanced salt solution.  The anterior chamber was inflated to a physiologic pressure with balanced salt solution.  Intracameral vigamox 0.1 mL undiltued was injected into the eye and a drop placed onto the ocular surface.  No wound leaks were noted.  The patient was taken to the recovery room in stable condition without complications of anesthesia or surgery  Benay Pillow 01/04/2017, 11:08 AM

## 2017-01-04 NOTE — Transfer of Care (Signed)
Immediate Anesthesia Transfer of Care Note  Patient: Jesse Beltran  Procedure(s) Performed: Procedure(s) with comments: CATARACT EXTRACTION PHACO AND INTRAOCULAR LENS PLACEMENT (IOC) (Left) - [place] 47.1 AP6.6 CDE3.07 lot # [id number] H LOT # [id number]  Patient Location: PACU  Anesthesia Type:MAC  Level of Consciousness: awake, alert  and oriented  Airway & Oxygen Therapy: Patient Spontanous Breathing  Post-op Assessment: Report given to RN and Post -op Vital signs reviewed and stable  Post vital signs: Reviewed and stable  Last Vitals:  Vitals:   01/04/17 0909 01/04/17 1111  BP: (!) 159/77 126/71  Pulse: 79 69  Resp: 16 18  Temp: 36.1 C     Last Pain:  Vitals:   01/04/17 1111  TempSrc: Temporal         Complications: No apparent anesthesia complications

## 2017-01-04 NOTE — Discharge Instructions (Signed)
Eye Surgery Discharge Instructions  Expect mild scratchy sensation or mild soreness. DO NOT RUB YOUR EYE!  The day of surgery:  Minimal physical activity, but bed rest is not required  No reading, computer work, or close hand work  No bending, lifting, or straining.  May watch TV  For 24 hours:  No driving, legal decisions, or alcoholic beverages  Safety precautions  Eat anything you prefer: It is better to start with liquids, then soup then solid foods.  _____ Eye patch should be worn until postoperative exam tomorrow.  ____ Solar shield eyeglasses should be worn for comfort in the sunlight/patch while sleeping  Resume all regular medications including aspirin or Coumadin if these were discontinued prior to surgery. You may shower, bathe, shave, or wash your hair. Tylenol may be taken for mild discomfort.  Call your doctor if you experience significant pain, nausea, or vomiting, fever > 101 or other signs of infection. [phone] or [phone] Specific instructions:  Follow-up Information    Benay Pillow, MD Follow up.   Specialty:  Ophthalmology Why:  FOLLOW UP APPOINTMENT AT [facility] ON FRIDAY, THE 13TH OF APRIL AT 10:45AM. PLEASE WEAR YOUR CATARACT GLASSES AND BRING REGULAR GLASSES. Contact information: [address] [place] [address] [phone]

## 2017-01-04 NOTE — Anesthesia Preprocedure Evaluation (Addendum)
Anesthesia Evaluation  Patient identified by MRN, date of birth, ID band Patient awake    Reviewed: Allergy & Precautions, NPO status , Patient's Chart, lab work & pertinent test results, reviewed documented beta blocker date and time   Airway Mallampati: II  TM Distance: >3 FB     Dental  (+) Chipped, Upper Dentures, Lower Dentures   Pulmonary shortness of breath and with exertion, COPD, former smoker,    Pulmonary exam normal        Cardiovascular negative cardio ROS Normal cardiovascular exam     Neuro/Psych negative neurological ROS  negative psych ROS   GI/Hepatic Neg liver ROS, GERD  ,  Endo/Other  negative endocrine ROS  Renal/GU negative Renal ROS  negative genitourinary   Musculoskeletal  (+) Arthritis ,   Abdominal Normal abdominal exam  (+)   Peds negative pediatric ROS (+)  Hematology negative hematology ROS (+)   Anesthesia Other Findings   Reproductive/Obstetrics                             Anesthesia Physical  Anesthesia Plan  ASA: III  Anesthesia Plan: MAC   Post-op Pain Management:    Induction: Intravenous  Airway Management Planned: Nasal Cannula  Additional Equipment:   Intra-op Plan:   Post-operative Plan:   Informed Consent: I have reviewed the patients History and Physical, chart, labs and discussed the procedure including the risks, benefits and alternatives for the proposed anesthesia with the patient or authorized representative who has indicated his/her understanding and acceptance.     Plan Discussed with: CRNA and Surgeon  Anesthesia Plan Comments:         Anesthesia Quick Evaluation

## 2017-01-04 NOTE — Anesthesia Postprocedure Evaluation (Signed)
Anesthesia Post Note  Patient: Jesse Beltran  Procedure(s) Performed: Procedure(s) (LRB): CATARACT EXTRACTION PHACO AND INTRAOCULAR LENS PLACEMENT (IOC) (Left)  Patient location during evaluation: PACU Anesthesia Type: MAC Level of consciousness: awake and alert Pain management: pain level controlled Vital Signs Assessment: post-procedure vital signs reviewed and stable Respiratory status: spontaneous breathing, nonlabored ventilation, respiratory function stable and patient connected to nasal cannula oxygen Cardiovascular status: stable and blood pressure returned to baseline Anesthetic complications: no     Last Vitals:  Vitals:   01/04/17 0909 01/04/17 1111  BP: (!) 159/77 126/71  Pulse: 79 70  Resp: 16 15  Temp: 36.1 C 36.2 C    Last Pain:  Vitals:   01/04/17 1111  TempSrc: Temporal                 Darlyne Russian

## 2017-01-15 DIAGNOSIS — H353221 Exudative age-related macular degeneration, left eye, with active choroidal neovascularization: Secondary | ICD-10-CM | POA: Diagnosis not present

## 2017-01-17 DIAGNOSIS — M1710 Unilateral primary osteoarthritis, unspecified knee: Secondary | ICD-10-CM | POA: Diagnosis not present

## 2017-01-17 DIAGNOSIS — L989 Disorder of the skin and subcutaneous tissue, unspecified: Secondary | ICD-10-CM | POA: Diagnosis not present

## 2017-01-17 DIAGNOSIS — Z79899 Other long term (current) drug therapy: Secondary | ICD-10-CM | POA: Diagnosis not present

## 2017-01-17 DIAGNOSIS — R600 Localized edema: Secondary | ICD-10-CM | POA: Diagnosis not present

## 2017-01-17 DIAGNOSIS — E782 Mixed hyperlipidemia: Secondary | ICD-10-CM | POA: Diagnosis not present

## 2017-01-17 DIAGNOSIS — Z0000 Encounter for general adult medical examination without abnormal findings: Secondary | ICD-10-CM | POA: Diagnosis not present

## 2017-01-17 DIAGNOSIS — Z1389 Encounter for screening for other disorder: Secondary | ICD-10-CM | POA: Diagnosis not present

## 2017-01-17 DIAGNOSIS — Z9181 History of falling: Secondary | ICD-10-CM | POA: Diagnosis not present

## 2017-01-17 DIAGNOSIS — J45909 Unspecified asthma, uncomplicated: Secondary | ICD-10-CM | POA: Diagnosis not present

## 2017-01-17 DIAGNOSIS — Z6825 Body mass index (BMI) 25.0-25.9, adult: Secondary | ICD-10-CM | POA: Diagnosis not present

## 2017-01-25 DIAGNOSIS — D2239 Melanocytic nevi of other parts of face: Secondary | ICD-10-CM | POA: Diagnosis not present

## 2017-01-25 DIAGNOSIS — L821 Other seborrheic keratosis: Secondary | ICD-10-CM | POA: Diagnosis not present

## 2017-01-25 DIAGNOSIS — L570 Actinic keratosis: Secondary | ICD-10-CM | POA: Diagnosis not present

## 2017-01-25 DIAGNOSIS — C44311 Basal cell carcinoma of skin of nose: Secondary | ICD-10-CM | POA: Diagnosis not present

## 2017-02-01 DIAGNOSIS — J309 Allergic rhinitis, unspecified: Secondary | ICD-10-CM | POA: Diagnosis not present

## 2017-02-01 DIAGNOSIS — J9801 Acute bronchospasm: Secondary | ICD-10-CM | POA: Diagnosis not present

## 2017-02-01 DIAGNOSIS — R5383 Other fatigue: Secondary | ICD-10-CM | POA: Diagnosis not present

## 2017-02-01 DIAGNOSIS — J209 Acute bronchitis, unspecified: Secondary | ICD-10-CM | POA: Diagnosis not present

## 2017-02-07 DIAGNOSIS — C44311 Basal cell carcinoma of skin of nose: Secondary | ICD-10-CM | POA: Diagnosis not present

## 2017-02-21 DIAGNOSIS — C44629 Squamous cell carcinoma of skin of left upper limb, including shoulder: Secondary | ICD-10-CM | POA: Diagnosis not present

## 2017-03-26 DIAGNOSIS — H353221 Exudative age-related macular degeneration, left eye, with active choroidal neovascularization: Secondary | ICD-10-CM | POA: Diagnosis not present

## 2017-04-13 DIAGNOSIS — S50819A Abrasion of unspecified forearm, initial encounter: Secondary | ICD-10-CM | POA: Diagnosis not present

## 2017-04-13 DIAGNOSIS — Z139 Encounter for screening, unspecified: Secondary | ICD-10-CM | POA: Diagnosis not present

## 2017-04-13 DIAGNOSIS — Z23 Encounter for immunization: Secondary | ICD-10-CM | POA: Diagnosis not present

## 2017-04-13 DIAGNOSIS — Z6823 Body mass index (BMI) 23.0-23.9, adult: Secondary | ICD-10-CM | POA: Diagnosis not present

## 2017-04-13 DIAGNOSIS — L0390 Cellulitis, unspecified: Secondary | ICD-10-CM | POA: Diagnosis not present

## 2017-05-21 DIAGNOSIS — H353221 Exudative age-related macular degeneration, left eye, with active choroidal neovascularization: Secondary | ICD-10-CM | POA: Diagnosis not present

## 2017-07-10 DIAGNOSIS — H353231 Exudative age-related macular degeneration, bilateral, with active choroidal neovascularization: Secondary | ICD-10-CM | POA: Diagnosis not present

## 2017-07-24 DIAGNOSIS — R600 Localized edema: Secondary | ICD-10-CM | POA: Diagnosis not present

## 2017-07-24 DIAGNOSIS — Z6825 Body mass index (BMI) 25.0-25.9, adult: Secondary | ICD-10-CM | POA: Diagnosis not present

## 2017-07-24 DIAGNOSIS — Z23 Encounter for immunization: Secondary | ICD-10-CM | POA: Diagnosis not present

## 2017-08-08 DIAGNOSIS — H353231 Exudative age-related macular degeneration, bilateral, with active choroidal neovascularization: Secondary | ICD-10-CM | POA: Diagnosis not present

## 2017-09-05 DIAGNOSIS — H353231 Exudative age-related macular degeneration, bilateral, with active choroidal neovascularization: Secondary | ICD-10-CM | POA: Diagnosis not present

## 2017-10-17 DIAGNOSIS — H353231 Exudative age-related macular degeneration, bilateral, with active choroidal neovascularization: Secondary | ICD-10-CM | POA: Diagnosis not present

## 2017-11-28 DIAGNOSIS — H353231 Exudative age-related macular degeneration, bilateral, with active choroidal neovascularization: Secondary | ICD-10-CM | POA: Diagnosis not present

## 2018-01-09 DIAGNOSIS — H353231 Exudative age-related macular degeneration, bilateral, with active choroidal neovascularization: Secondary | ICD-10-CM | POA: Diagnosis not present

## 2018-01-22 DIAGNOSIS — E782 Mixed hyperlipidemia: Secondary | ICD-10-CM | POA: Diagnosis not present

## 2018-01-22 DIAGNOSIS — E663 Overweight: Secondary | ICD-10-CM | POA: Diagnosis not present

## 2018-01-22 DIAGNOSIS — R600 Localized edema: Secondary | ICD-10-CM | POA: Diagnosis not present

## 2018-01-22 DIAGNOSIS — E538 Deficiency of other specified B group vitamins: Secondary | ICD-10-CM | POA: Diagnosis not present

## 2018-01-22 DIAGNOSIS — K219 Gastro-esophageal reflux disease without esophagitis: Secondary | ICD-10-CM | POA: Diagnosis not present

## 2018-01-22 DIAGNOSIS — Z1331 Encounter for screening for depression: Secondary | ICD-10-CM | POA: Diagnosis not present

## 2018-01-22 DIAGNOSIS — Z6825 Body mass index (BMI) 25.0-25.9, adult: Secondary | ICD-10-CM | POA: Diagnosis not present

## 2018-01-22 DIAGNOSIS — Z9181 History of falling: Secondary | ICD-10-CM | POA: Diagnosis not present

## 2018-02-20 DIAGNOSIS — H353231 Exudative age-related macular degeneration, bilateral, with active choroidal neovascularization: Secondary | ICD-10-CM | POA: Diagnosis not present

## 2018-02-21 DIAGNOSIS — Z1331 Encounter for screening for depression: Secondary | ICD-10-CM | POA: Diagnosis not present

## 2018-02-21 DIAGNOSIS — Z9181 History of falling: Secondary | ICD-10-CM | POA: Diagnosis not present

## 2018-02-21 DIAGNOSIS — Z139 Encounter for screening, unspecified: Secondary | ICD-10-CM | POA: Diagnosis not present

## 2018-02-21 DIAGNOSIS — E538 Deficiency of other specified B group vitamins: Secondary | ICD-10-CM | POA: Diagnosis not present

## 2018-02-21 DIAGNOSIS — Z125 Encounter for screening for malignant neoplasm of prostate: Secondary | ICD-10-CM | POA: Diagnosis not present

## 2018-02-21 DIAGNOSIS — Z0000 Encounter for general adult medical examination without abnormal findings: Secondary | ICD-10-CM | POA: Diagnosis not present

## 2018-03-20 DIAGNOSIS — H353231 Exudative age-related macular degeneration, bilateral, with active choroidal neovascularization: Secondary | ICD-10-CM | POA: Diagnosis not present

## 2018-04-18 DIAGNOSIS — H353231 Exudative age-related macular degeneration, bilateral, with active choroidal neovascularization: Secondary | ICD-10-CM | POA: Diagnosis not present

## 2018-05-21 DIAGNOSIS — H353231 Exudative age-related macular degeneration, bilateral, with active choroidal neovascularization: Secondary | ICD-10-CM | POA: Diagnosis not present

## 2018-06-26 DIAGNOSIS — H353231 Exudative age-related macular degeneration, bilateral, with active choroidal neovascularization: Secondary | ICD-10-CM | POA: Diagnosis not present

## 2018-07-23 DIAGNOSIS — Z1331 Encounter for screening for depression: Secondary | ICD-10-CM | POA: Diagnosis not present

## 2018-07-23 DIAGNOSIS — K219 Gastro-esophageal reflux disease without esophagitis: Secondary | ICD-10-CM | POA: Diagnosis not present

## 2018-07-23 DIAGNOSIS — Z6825 Body mass index (BMI) 25.0-25.9, adult: Secondary | ICD-10-CM | POA: Diagnosis not present

## 2018-07-23 DIAGNOSIS — Z1339 Encounter for screening examination for other mental health and behavioral disorders: Secondary | ICD-10-CM | POA: Diagnosis not present

## 2018-07-23 DIAGNOSIS — E663 Overweight: Secondary | ICD-10-CM | POA: Diagnosis not present

## 2018-07-23 DIAGNOSIS — Z9181 History of falling: Secondary | ICD-10-CM | POA: Diagnosis not present

## 2018-07-23 DIAGNOSIS — Z23 Encounter for immunization: Secondary | ICD-10-CM | POA: Diagnosis not present

## 2018-07-23 DIAGNOSIS — T148XXA Other injury of unspecified body region, initial encounter: Secondary | ICD-10-CM | POA: Diagnosis not present

## 2018-07-23 DIAGNOSIS — R600 Localized edema: Secondary | ICD-10-CM | POA: Diagnosis not present

## 2018-07-31 DIAGNOSIS — H353221 Exudative age-related macular degeneration, left eye, with active choroidal neovascularization: Secondary | ICD-10-CM | POA: Diagnosis not present

## 2018-09-05 DIAGNOSIS — H353221 Exudative age-related macular degeneration, left eye, with active choroidal neovascularization: Secondary | ICD-10-CM | POA: Diagnosis not present

## 2018-10-10 DIAGNOSIS — H353221 Exudative age-related macular degeneration, left eye, with active choroidal neovascularization: Secondary | ICD-10-CM | POA: Diagnosis not present

## 2018-11-14 DIAGNOSIS — H353221 Exudative age-related macular degeneration, left eye, with active choroidal neovascularization: Secondary | ICD-10-CM | POA: Diagnosis not present

## 2018-12-26 DIAGNOSIS — I701 Atherosclerosis of renal artery: Secondary | ICD-10-CM | POA: Diagnosis not present

## 2018-12-26 DIAGNOSIS — H353221 Exudative age-related macular degeneration, left eye, with active choroidal neovascularization: Secondary | ICD-10-CM | POA: Diagnosis not present

## 2018-12-26 DIAGNOSIS — R532 Functional quadriplegia: Secondary | ICD-10-CM | POA: Diagnosis not present

## 2018-12-26 DIAGNOSIS — Z23 Encounter for immunization: Secondary | ICD-10-CM | POA: Diagnosis not present

## 2018-12-26 DIAGNOSIS — I10 Essential (primary) hypertension: Secondary | ICD-10-CM | POA: Diagnosis not present

## 2018-12-26 DIAGNOSIS — Z9889 Other specified postprocedural states: Secondary | ICD-10-CM | POA: Diagnosis not present

## 2018-12-26 DIAGNOSIS — Z6836 Body mass index (BMI) 36.0-36.9, adult: Secondary | ICD-10-CM | POA: Diagnosis not present

## 2018-12-26 DIAGNOSIS — I714 Abdominal aortic aneurysm, without rupture: Secondary | ICD-10-CM | POA: Diagnosis not present

## 2018-12-26 DIAGNOSIS — I739 Peripheral vascular disease, unspecified: Secondary | ICD-10-CM | POA: Diagnosis not present

## 2018-12-26 DIAGNOSIS — E1165 Type 2 diabetes mellitus with hyperglycemia: Secondary | ICD-10-CM | POA: Diagnosis not present

## 2019-02-20 DIAGNOSIS — H353221 Exudative age-related macular degeneration, left eye, with active choroidal neovascularization: Secondary | ICD-10-CM | POA: Diagnosis not present

## 2019-02-24 DIAGNOSIS — Z9181 History of falling: Secondary | ICD-10-CM | POA: Diagnosis not present

## 2019-02-24 DIAGNOSIS — Z0000 Encounter for general adult medical examination without abnormal findings: Secondary | ICD-10-CM | POA: Diagnosis not present

## 2019-02-24 DIAGNOSIS — Z1331 Encounter for screening for depression: Secondary | ICD-10-CM | POA: Diagnosis not present

## 2019-04-16 DIAGNOSIS — H353231 Exudative age-related macular degeneration, bilateral, with active choroidal neovascularization: Secondary | ICD-10-CM | POA: Diagnosis not present

## 2019-04-18 DIAGNOSIS — H3530 Unspecified macular degeneration: Secondary | ICD-10-CM | POA: Diagnosis not present

## 2019-04-18 DIAGNOSIS — Z139 Encounter for screening, unspecified: Secondary | ICD-10-CM | POA: Diagnosis not present

## 2019-04-18 DIAGNOSIS — R600 Localized edema: Secondary | ICD-10-CM | POA: Diagnosis not present

## 2019-04-18 DIAGNOSIS — K219 Gastro-esophageal reflux disease without esophagitis: Secondary | ICD-10-CM | POA: Diagnosis not present

## 2019-05-14 DIAGNOSIS — H353221 Exudative age-related macular degeneration, left eye, with active choroidal neovascularization: Secondary | ICD-10-CM | POA: Diagnosis not present

## 2019-06-20 DIAGNOSIS — H353221 Exudative age-related macular degeneration, left eye, with active choroidal neovascularization: Secondary | ICD-10-CM | POA: Diagnosis not present

## 2019-07-31 DIAGNOSIS — H353221 Exudative age-related macular degeneration, left eye, with active choroidal neovascularization: Secondary | ICD-10-CM | POA: Diagnosis not present

## 2019-09-03 DIAGNOSIS — H353221 Exudative age-related macular degeneration, left eye, with active choroidal neovascularization: Secondary | ICD-10-CM | POA: Diagnosis not present

## 2019-10-16 DIAGNOSIS — H353221 Exudative age-related macular degeneration, left eye, with active choroidal neovascularization: Secondary | ICD-10-CM | POA: Diagnosis not present

## 2019-11-20 DIAGNOSIS — H353221 Exudative age-related macular degeneration, left eye, with active choroidal neovascularization: Secondary | ICD-10-CM | POA: Diagnosis not present

## 2019-12-24 DIAGNOSIS — H353221 Exudative age-related macular degeneration, left eye, with active choroidal neovascularization: Secondary | ICD-10-CM | POA: Diagnosis not present

## 2019-12-29 DIAGNOSIS — D485 Neoplasm of uncertain behavior of skin: Secondary | ICD-10-CM | POA: Diagnosis not present

## 2020-01-28 DIAGNOSIS — H353221 Exudative age-related macular degeneration, left eye, with active choroidal neovascularization: Secondary | ICD-10-CM | POA: Diagnosis not present

## 2020-02-26 DIAGNOSIS — Z9181 History of falling: Secondary | ICD-10-CM | POA: Diagnosis not present

## 2020-02-26 DIAGNOSIS — Z1331 Encounter for screening for depression: Secondary | ICD-10-CM | POA: Diagnosis not present

## 2020-02-26 DIAGNOSIS — Z0000 Encounter for general adult medical examination without abnormal findings: Secondary | ICD-10-CM | POA: Diagnosis not present

## 2020-03-02 DIAGNOSIS — D485 Neoplasm of uncertain behavior of skin: Secondary | ICD-10-CM | POA: Diagnosis not present

## 2020-03-02 DIAGNOSIS — D044 Carcinoma in situ of skin of scalp and neck: Secondary | ICD-10-CM | POA: Diagnosis not present

## 2020-03-02 DIAGNOSIS — L821 Other seborrheic keratosis: Secondary | ICD-10-CM | POA: Diagnosis not present

## 2020-03-24 DIAGNOSIS — H353221 Exudative age-related macular degeneration, left eye, with active choroidal neovascularization: Secondary | ICD-10-CM | POA: Diagnosis not present

## 2020-05-05 DIAGNOSIS — H353221 Exudative age-related macular degeneration, left eye, with active choroidal neovascularization: Secondary | ICD-10-CM | POA: Diagnosis not present

## 2020-06-30 DIAGNOSIS — H353221 Exudative age-related macular degeneration, left eye, with active choroidal neovascularization: Secondary | ICD-10-CM | POA: Diagnosis not present

## 2020-08-25 DIAGNOSIS — H353221 Exudative age-related macular degeneration, left eye, with active choroidal neovascularization: Secondary | ICD-10-CM | POA: Diagnosis not present

## 2020-10-27 DIAGNOSIS — H353221 Exudative age-related macular degeneration, left eye, with active choroidal neovascularization: Secondary | ICD-10-CM | POA: Diagnosis not present

## 2020-12-22 DIAGNOSIS — H353221 Exudative age-related macular degeneration, left eye, with active choroidal neovascularization: Secondary | ICD-10-CM | POA: Diagnosis not present

## 2021-01-18 DIAGNOSIS — M1712 Unilateral primary osteoarthritis, left knee: Secondary | ICD-10-CM | POA: Diagnosis not present

## 2021-01-18 DIAGNOSIS — M6281 Muscle weakness (generalized): Secondary | ICD-10-CM | POA: Diagnosis not present

## 2021-01-18 DIAGNOSIS — M1711 Unilateral primary osteoarthritis, right knee: Secondary | ICD-10-CM | POA: Diagnosis not present

## 2021-02-27 DIAGNOSIS — Z20822 Contact with and (suspected) exposure to covid-19: Secondary | ICD-10-CM | POA: Diagnosis not present

## 2021-02-27 DIAGNOSIS — G9389 Other specified disorders of brain: Secondary | ICD-10-CM | POA: Diagnosis not present

## 2021-02-27 DIAGNOSIS — R29702 NIHSS score 2: Secondary | ICD-10-CM | POA: Diagnosis not present

## 2021-02-27 DIAGNOSIS — I639 Cerebral infarction, unspecified: Secondary | ICD-10-CM | POA: Diagnosis not present

## 2021-02-27 DIAGNOSIS — G238 Other specified degenerative diseases of basal ganglia: Secondary | ICD-10-CM | POA: Diagnosis not present

## 2021-02-27 DIAGNOSIS — I447 Left bundle-branch block, unspecified: Secondary | ICD-10-CM | POA: Diagnosis not present

## 2021-02-28 DIAGNOSIS — I6523 Occlusion and stenosis of bilateral carotid arteries: Secondary | ICD-10-CM | POA: Diagnosis not present

## 2021-02-28 DIAGNOSIS — Z66 Do not resuscitate: Secondary | ICD-10-CM | POA: Diagnosis not present

## 2021-02-28 DIAGNOSIS — Z20822 Contact with and (suspected) exposure to covid-19: Secondary | ICD-10-CM | POA: Diagnosis not present

## 2021-02-28 DIAGNOSIS — Z87891 Personal history of nicotine dependence: Secondary | ICD-10-CM | POA: Diagnosis not present

## 2021-02-28 DIAGNOSIS — I429 Cardiomyopathy, unspecified: Secondary | ICD-10-CM | POA: Diagnosis not present

## 2021-02-28 DIAGNOSIS — R29705 NIHSS score 5: Secondary | ICD-10-CM | POA: Diagnosis not present

## 2021-02-28 DIAGNOSIS — G458 Other transient cerebral ischemic attacks and related syndromes: Secondary | ICD-10-CM | POA: Diagnosis not present

## 2021-02-28 DIAGNOSIS — Z7982 Long term (current) use of aspirin: Secondary | ICD-10-CM | POA: Diagnosis not present

## 2021-02-28 DIAGNOSIS — I340 Nonrheumatic mitral (valve) insufficiency: Secondary | ICD-10-CM | POA: Diagnosis not present

## 2021-02-28 DIAGNOSIS — Z743 Need for continuous supervision: Secondary | ICD-10-CM | POA: Diagnosis not present

## 2021-02-28 DIAGNOSIS — G459 Transient cerebral ischemic attack, unspecified: Secondary | ICD-10-CM | POA: Diagnosis not present

## 2021-02-28 DIAGNOSIS — I639 Cerebral infarction, unspecified: Secondary | ICD-10-CM | POA: Diagnosis not present

## 2021-02-28 DIAGNOSIS — G8324 Monoplegia of upper limb affecting left nondominant side: Secondary | ICD-10-CM | POA: Diagnosis not present

## 2021-02-28 DIAGNOSIS — I447 Left bundle-branch block, unspecified: Secondary | ICD-10-CM | POA: Diagnosis not present

## 2021-02-28 DIAGNOSIS — R531 Weakness: Secondary | ICD-10-CM | POA: Diagnosis not present

## 2021-02-28 DIAGNOSIS — M25552 Pain in left hip: Secondary | ICD-10-CM | POA: Diagnosis not present

## 2021-02-28 DIAGNOSIS — R0689 Other abnormalities of breathing: Secondary | ICD-10-CM | POA: Diagnosis not present

## 2021-02-28 DIAGNOSIS — I6503 Occlusion and stenosis of bilateral vertebral arteries: Secondary | ICD-10-CM | POA: Diagnosis not present

## 2021-02-28 DIAGNOSIS — I499 Cardiac arrhythmia, unspecified: Secondary | ICD-10-CM | POA: Diagnosis not present

## 2021-02-28 DIAGNOSIS — M1612 Unilateral primary osteoarthritis, left hip: Secondary | ICD-10-CM | POA: Diagnosis not present

## 2021-03-01 NOTE — Discharge Summary (Addendum)
 "      Jesse Beltran         Admit date:    02/28/2021 Discharge date:   03/01/2021 Length of stay:    LOS: 1 day     Discharge Service:   [facility] Discharge Attending Physician: Cinderella Cera, MD Discharge to:    To Home with Home Health Condition at Discharge:  good Code Status:    DNR and DNI Primary Care Provider:  CHARLENE LITTIE SINGLE, MD  Consults:      Neurology consult  Discharge Diagnoses  Principal Problem:   Acute ischemic stroke (CMS-HCC) Resolved Problems:   * No resolved hospital problems. [facility]  Patient was transferred from outside hospital for further evaluation and treatment for acute stroke.  Patient was not a candidate for tPA.  Patient hemoglobin A1c 5.6.  LDL 105.  Echocardiogram revealed EF 45% patient compensated, patient probably might need to be evaluated outpatient by cardiologist in the future for compensated cardiomyopathy.  Patient was seen and followed by neurology, physical therapy, Occupational Therapy, speech therapy.  Patient had MRI scan of the brain confirmed acute stroke.  MRA of the head and neck without significant finding.  Recommendation for patient to be discharged home with home health.  Patient will be followed outpatient by neurology, primary care provider.  Patient was started on a statin.  Patient TSH slightly elevated free T3 and free T4 was okay.  To be followed outpatient by primary care doctor.  Patient will be discharged on aspirin indefinitely.  Patient will be on Plavix for 21 days Discussed with patient and his family day discharge agree with recommendation  I spent greater than 30 mins in the discharge of this patient.  Pending Test Results    Procedures   MRI scan of the brain, MRA of the head, MRA of the neck, carotid Doppler, echocardiogram  Discharge Medications   Current Discharge Medication List    START taking these medications   Details  acetaminophen  (TYLENOL ) 325 MG tablet  Take 2 tablets (650 mg total) by mouth every four (4) hours as needed. Refills: 0    aspirin 81 MG chewable tablet Chew 1 tablet (81 mg total)  in the morning. Qty: 30 tablet, Refills: 0    clopidogreL (PLAVIX) 75 mg tablet Take 1 tablet (75 mg total) by mouth in the morning for 21 days. Qty: 21 tablet, Refills: 0    simvastatin (ZOCOR) 40 MG tablet Take 1 tablet (40 mg total) by mouth nightly. Qty: 30 tablet, Refills: 0      CONTINUE these medications which have NOT CHANGED   Details  therapeutic multivitamin (THERAGRAN) tablet Take 1 tablet by mouth in the morning.        Discharge Instructions   Diet Instructions    Discharge diet (specify)     Nutrition Therapy: Cardiac (Heart Healthy Low Sodium)   Discharge Nutrition Therapy: Heart Healthy     Activity Instructions    Activity as tolerated       Other Instructions    Call MD for:  difficulty breathing, headache or visual disturbances     Call MD for:  hives     Call MD for:  persistent dizziness or light-headedness     Call MD for:  persistent nausea or vomiting     Call MD for:  redness, tenderness, or signs of infection (pain, swelling, redness, odor or green/yellow discharge around incision site)     Call  MD for:  severe uncontrolled pain     Call MD for:  temperature >38.5 Celsius      Follow Up instructions and Outpatient Referrals    Ambulatory referral to Home Health     Is this a [facility] or [facility] Patient?: No   Physician to follow patient's care: PCP   Disciplines requested:  Physical Therapy Occupational Therapy     Physical Therapy requested: Evaluate and treat   Occupational Therapy Requested: Evaluate and treat   Call MD for:  difficulty breathing, headache or visual disturbances     Call MD for:  hives     Call MD for:  persistent dizziness or light-headedness     Call MD for:  persistent nausea or vomiting     Call MD for:  redness, tenderness, or signs of infection (pain,  swelling,  redness, odor or green/yellow discharge around incision site)     Call MD for:  severe uncontrolled pain     Call MD for:  temperature >38.5 Celsius      Resources and Referrals    Commode     Length of Need: 99   Type of commode: Chair    No future appointments.  Medical History  No past medical history on file.  Lab Results    BLOOD Recent Labs  Lab Units 02/27/21 0521  WBC 10*9/L 6.4  HEMOGLOBIN g/dL 85.3  HEMATOCRIT % 55.6  PLATELET COUNT (1) 10*9/L 101*   Recent Labs  Lab Units 02/27/21 0522 02/27/21 0521  SODIUM mmol/L  --  140  POTASSIUM mmol/L  --  3.6  CHLORIDE mmol/L  --  106  CO2 mmol/L  --  22.0  BUN mg/dL  --  13  CREATININE mg/dL  --  9.09  GLUCOSE mg/dL  --  894  CALCIUM mg/dL  --  8.6  ALBUMIN g/dL  --  4.1  PROTEIN TOTAL g/dL  --  7.0  BILIRUBIN TOTAL mg/dL  --  0.5  AST U/L  --  24  ALT U/L  --  13  ALK PHOS U/L  --  85  LACTATE mmol/L 1.5  --    Recent Labs  Lab Units 02/28/21 0503 02/27/21 0521  TROPONIN I ng/mL  --  <0.034  CHOLESTEROL mg/dL 839  --   LDL CALC mg/dL 894*  --   HDL mg/dL 31*  --   TRIGLYCERIDES mg/dL 880  --    No results in the last week Recent Labs  Lab Units 02/28/21 1018 02/28/21 0503  TSH uIU/mL  --  6.575*  T3 FREE pg/mL  --  3.29  HEMOGLOBIN A1C % 5.4  --     No results in the last week  URINE Recent Labs  Lab Units 02/27/21 0726  WBC UA /HPF 0  NITRITE UA  Negative  LEUKOCYTES UA  Negative  BACTERIA UA /HPF None Seen  RBC UA /HPF 1  BLOOD UA  Negative  GLUCOSE UA  Negative  PROTEIN UA  Negative  KETONES UA  Negative   No results in the last week  BODY FLUIDS No results in the last week  ABG No results for input(s): O2SOUR, FIO2ART, PHART, PCO2ART, PO2ART, HCO3ART, O2SATART, BEART in the last 72 hours.  Microbiology Results (last day)    ** No results found for the last 24 hours. **      Imaging   XR Hip 2 Views Left  Result Date: 02/28/2021 Exam:  Left Hip  History:   87 year old.  Status post fall.  Left hip pain. Technique:  2 views Comparison:  None. Findings:  No acute abnormalities are evident.  Negative for fracture or malalignment.  Femoral head contour well-maintained on the left.  Mild degenerative changes are present, with acetabular roof sclerosis, and mild marginal osteophyte formation.    1.  Negative for fracture. 2.  Mild osteoarthritis. Signed (Electronic Signature): 02/28/2021 11:39 AM Signed By: Fairy KATHEE Shipper, MD  MRA Head Wo Contrast  Result Date: 02/28/2021 Exam:  Head MRA without contrast (Circle of Willis) History:  Left arm weakness, acute infarction Technique:  3-D time-of-flight MR angiography of the circle of Willis without contrast. Comparison:  Brain MRI 02/28/2021 Findings:  The vertebral arteries are codominant. The posterior communicating arteries are atrophic. There is no visible intracranial stenosis or vessel occlusion. There is no cerebral aneurysm. Impression:  1. Negative head MRA. Signed (Electronic Signature): 02/28/2021 2:41 PM Signed By: Aleene JONELLE Cerise, MD  MRA Neck W Wo Contrast  Result Date: 02/28/2021 Exam:  MRA Neck without and with IV contrast History: Acute cerebral infarction Technique: Complete MRI a the neck without and with IV contrast. Image postprocessing and 3-D reconstructions at an independent workstation is performed and the rendered images that are generated are sent to the patient's image file in PACS. Comparison: Brain MRI 02/28/2021 Findings:  AORTIC ARCH: The most common configuration of arch anatomy is present. The proximal great vessels are unremarkable. VERTEBRAL ARTERIES: The vertebral arteries are codominant. There is mild stenosis at the origin of both vertebral arteries. There is antegrade flow in both vertebral arteries. RIGHT CAROTID SYSTEM: The right common carotid artery, carotid bifurcation, internal carotid artery, and external carotid artery appear normal. There is antegrade flow in the right common  carotid artery. LEFT CAROTID SYSTEM: The left common carotid artery, carotid bifurcation, internal carotid artery, and external carotid artery appear normal. There is antegrade flow in the left common carotid artery. Incidentally, the proximal portion of the left internal carotid artery is quite tortuous. COMMENT:  All measurements of carotid stenosis in the report above were calculated using the [facility] Symptomatic Carotid Endarterectomy Trial) methodology for measuring carotid artery stenosis, whereby the narrowest transverse measurement of luminal dimension at the the point of the maximum stenosis is stated relative to the diameter of the closest normal-appearing portion of the same vessel beyond the stenosis.   1. Mild stenosis at the origin of both vertebral arteries. The vertebral arteries are codominant. 2. Otherwise negative neck MRA. Incidental note is made of pronounced tortuosity of the proximal left internal carotid artery. Signed (Electronic Signature): 02/28/2021 2:47 PM Signed By: Aleene JONELLE Cerise, MD  MRI Brain Wo Contrast  Result Date: 02/28/2021 Exam:  MRI Brain without Contrast History:  Left upper extremity weakness, concern for CVA. Technique:  Complete brain MRI without IV contrast. Comparison: Outside Brain CT 02/27/2021 Findings: Small area of restricted diffusion involving the white matter adjacent to the superior lateral aspect of the right thalamus consistent with acute infarction. No associated hemorrhage or mass effect. Remote right paramedian parietal lobe infarct. Small remote left cerebellar infarcts. Multifocal and patchy white matter T-2/flair hyperintensities suggest mild to moderate chronic small vessel ischemic changes. Prominence of cortical sulci and ventricles in keeping with patient's age. No remote intracerebral hemorrhage on the GRE axial sequence. The orbits, optic chiasm, pituitary gland, and cavernous sinuses are grossly unremarkable.  There is no  significant disease of the paranasal sinuses or mastoid  air cells.   1. Small acute infarct involving the white matter adjacent to the superior lateral aspect of the right thalamus consistent with acute infarction. No associated hemorrhage or mass effect. 2. Remote right paramedian parietal lobe infarct. 3. Small remote left cerebellar infarcts. 4. Multifocal and patchy white matter T-2/flair hyperintensities suggest mild to moderate chronic small vessel ischemic changes. Signed (Electronic Signature): 02/28/2021 12:03 PM Signed By: Norleen Friday, MD  Echocardiogram  W Colorflow Spectral Doppler WITHOUT Bubble Study  Result Date: 02/28/2021 Patient Info Name:     Jesse  Beltran Age:     37 years DOB:     [birth year] Gender:     Male MRN:     [id number] Accession #:     [id number] RE Ht:     173 cm Wt:     75 kg BSA:     1.90 m2 HR:     79 bpm BP:     152 /     76 mmHg Technical Quality:     Fair Exam Date:     02/28/2021 7:01 AM Site Location:     [facility] Exam Location:     [facility] Admit Date:     02/28/2021 Exam Type:     ECHOCARDIOGRAM W COLORFLOW SPECTRAL DOPPLER Study Info Indications      - STROKE/TIA Complete two-dimensional, color flow and Doppler transthoracic echocardiogram is performed. Staff Referring Physician:     HESTER, LAURA ANN ; Sonographer:     Arline Lim, [place] Ordering Physician:     RAYNALDO LONNI CROME Account #:     [id number] Beltran   1. The left ventricle is normal in size with upper normal wall thickness.   2. The left ventricular systolic function is mildly decreased, LVEF is visually estimated at 45%.   3. There is mild mitral valve regurgitation.   4. The aortic valve is trileaflet with mildly thickened leaflets with normal excursion.   5. The right ventricle is upper normal in size, with normal systolic function.   6. No bubble study was performed. Left Ventricle   The left ventricle is normal in size with upper normal wall thickness.   The left ventricular systolic  function is mildly decreased, LVEF is visually estimated at 45%. Right Ventricle   The right ventricle is upper normal in size, with normal systolic function. Left Atrium   The left atrium is normal in size. Right Atrium   The right atrium is normal  in size. Aortic Valve   The aortic valve is trileaflet with mildly thickened leaflets with normal excursion. Pulmonic Valve   Pulmonary valve is not well visualized.   There is mild pulmonic regurgitation. Mitral Valve   The mitral valve leaflets are normal with normal leaflet mobility.   There is mild mitral valve regurgitation. Tricuspid Valve   The tricuspid valve leaflets are normal, with normal leaflet mobility.   There is mild tricuspid regurgitation. Pericardium/Pleural   There is no pericardial effusion. Inferior Vena Cava   The IVC is not well visualized precluding the ability to accurate assess right atrial pressure. Aorta   The aorta is normal in size in the visualized segments. Left Ventricular Outflow Tract ---------------------------------------------------------------------- Name                                 Value        Normal ---------------------------------------------------------------------- LVOT 2D ---------------------------------------------------------------------- LVOT Diameter  2.0 cm               LVOT Area                          3.1 cm2 Mitral Valve ---------------------------------------------------------------------- Name                                 Value        Normal ---------------------------------------------------------------------- MV Diastolic Function ---------------------------------------------------------------------- MV E Peak Velocity                 81 cm/s               MV A Peak Velocity                110 cm/s               MV E/A                                 0.7               MV Annular TDI ---------------------------------------------------------------------- MV Septal e' Velocity              5.6 cm/s         >=8.0 MV Lateral e' Velocity            8.3 cm/s        >=10.0 MV e' Average                          7.0               MV E/e' (Average)                     12.1 Tricuspid Valve ---------------------------------------------------------------------- Name                                 Value        Normal ---------------------------------------------------------------------- TV Regurgitation Doppler ---------------------------------------------------------------------- TR Peak Velocity                     2 m/s Aorta ---------------------------------------------------------------------- Name                                 Value        Normal ---------------------------------------------------------------------- Ascending Aorta ---------------------------------------------------------------------- Ao Root Diameter (2D)               3.3 cm               Ao Root Diam Index (2D)         17.3 cm/m2               Asc Ao Diameter                     3.3 cm Ventricles ---------------------------------------------------------------------- Name                                 Value        Normal ---------------------------------------------------------------------- LV Dimensions 2D/MM ---------------------------------------------------------------------- IVS  Diastolic Thickness (2D)        1.1 cm       0.6-1.0 LVID Diastole (2D)                  4.1 cm       4.2-5.8 LVPW Diastolic Thickness (2D)                                0.8 cm       0.6-1.0 LVID Systole (2D)                   3.3 cm       2.5-4.0 LVOT Diameter                       2.0 cm               RV Dimensions 2D/MM ---------------------------------------------------------------------- TAPSE                               2.0 cm         >=1.7 Atria ---------------------------------------------------------------------- Name                                 Value        Normal  ---------------------------------------------------------------------- LA Dimensions ---------------------------------------------------------------------- LA Volume (BP MOD)                   51 ml               LA Volume Index (BP MOD)       26.57 ml/m2   16.00-34.00 RA Dimensions ---------------------------------------------------------------------- RA Area (4C)                      14.3 cm2        <=18.0 RA Area (4C) Index              7.5 cm2/m2 Report Signatures Finalized by Lynwood Ammons   MD on 02/28/2021 08:24 AM  PVL Carotid Duplex Bilateral  Result Date: 02/28/2021  Carotid Duplex   Report  [address] [place], [place] [address]  [phone]  Rodriquez, Gable Exam Date: 02/28/2021 06:38 Ordering Physician: RAYNALDO LONNI CROME  Age: 87 Gender: M Exam Location: [facility] Referring Physician: CRISTOPHER DOFFING ANN  DOB: 1932/12/16 Ht (in): Wt (lb): Sonographer: Belvie Dears, BSRT,  RDMS, RVT, FSVU  MRN: [id number]  Exam Type: PVL CAROTID  DUPLEX BILATERAL  Procedure CPT:  Indications: STROKE/TIA  ICD Codes:  Risk Factors:  Previous Vascular Surgery:       RIGHT       LEFT  Velocity (cm/s) Velocity (cm/s)  Syst/Diast Syst/Diast  103.2 / 19.8  Distal ICA 93.4 / 17.0  112.9 / 19.2    Mid ICA 119.6 / 18.2  167.3 / 22.2   Prox ICA 160.6 / 34.2  67.6 / 15.8  Distal CCA 84.3 / 13.5  85.2 / 16.7   Prox CCA 84.8 / 15.5  146.7      ECA 218.2  2.47    ICA/CCA 1.91  Antegrade  Vertebral Antegrade  IMPRESSION  This was a duplex ultrasound exam (B-mode and color and pulsed wave Doppler) of the bilateral cervical carotid systems which were  evaluated in both  the longitudinal and transverse planes.  1. There is a moderate to severe stenosis of the right internal carotid artery (50-69% diameter reduction).  2. There is a moderate to severe stenosis of the left internal carotid artery (50-69% diameter reduction).  3. The bilateral external carotid arteries demonstrate elevated velocities.  4. The bilateral  vertebral arteries demonstrated spectral waveforms with antegrade flow.  5. The bilateral subclavian arteries demonstrated spectral waveforms without evidence of stenosis.  6. There is mild to moderate heterogeneous  plaque in the bilateral carotid bifurcations. ( Externsive plaque on the right )  Impression:  Bilateral moderate to severe stenosis of the internal carotid artery (50-69% diameter reduction).  Plaque as described above   Kerwin Score MD  (Electronically Signed)  Final Date: 28 February 2021 08:01              "

## 2021-03-09 DIAGNOSIS — H353221 Exudative age-related macular degeneration, left eye, with active choroidal neovascularization: Secondary | ICD-10-CM | POA: Diagnosis not present

## 2021-03-10 DIAGNOSIS — Z9181 History of falling: Secondary | ICD-10-CM | POA: Diagnosis not present

## 2021-03-10 DIAGNOSIS — Z6826 Body mass index (BMI) 26.0-26.9, adult: Secondary | ICD-10-CM | POA: Diagnosis not present

## 2021-03-10 DIAGNOSIS — K219 Gastro-esophageal reflux disease without esophagitis: Secondary | ICD-10-CM | POA: Diagnosis not present

## 2021-03-10 DIAGNOSIS — Z8673 Personal history of transient ischemic attack (TIA), and cerebral infarction without residual deficits: Secondary | ICD-10-CM | POA: Diagnosis not present

## 2021-03-10 DIAGNOSIS — Z139 Encounter for screening, unspecified: Secondary | ICD-10-CM | POA: Diagnosis not present

## 2021-03-10 DIAGNOSIS — I429 Cardiomyopathy, unspecified: Secondary | ICD-10-CM | POA: Diagnosis not present

## 2021-03-10 DIAGNOSIS — E7800 Pure hypercholesterolemia, unspecified: Secondary | ICD-10-CM | POA: Diagnosis not present

## 2021-03-10 DIAGNOSIS — Z7689 Persons encountering health services in other specified circumstances: Secondary | ICD-10-CM | POA: Diagnosis not present

## 2021-03-10 DIAGNOSIS — E038 Other specified hypothyroidism: Secondary | ICD-10-CM | POA: Diagnosis not present

## 2021-03-21 DIAGNOSIS — H9190 Unspecified hearing loss, unspecified ear: Secondary | ICD-10-CM | POA: Diagnosis not present

## 2021-03-21 DIAGNOSIS — R208 Other disturbances of skin sensation: Secondary | ICD-10-CM | POA: Diagnosis not present

## 2021-03-21 DIAGNOSIS — I639 Cerebral infarction, unspecified: Secondary | ICD-10-CM | POA: Diagnosis not present

## 2021-03-21 DIAGNOSIS — R2689 Other abnormalities of gait and mobility: Secondary | ICD-10-CM | POA: Diagnosis not present

## 2021-05-04 DIAGNOSIS — E038 Other specified hypothyroidism: Secondary | ICD-10-CM | POA: Diagnosis not present

## 2021-05-04 DIAGNOSIS — I429 Cardiomyopathy, unspecified: Secondary | ICD-10-CM | POA: Diagnosis not present

## 2021-05-04 DIAGNOSIS — Z8673 Personal history of transient ischemic attack (TIA), and cerebral infarction without residual deficits: Secondary | ICD-10-CM | POA: Diagnosis not present

## 2021-05-04 DIAGNOSIS — Z6825 Body mass index (BMI) 25.0-25.9, adult: Secondary | ICD-10-CM | POA: Diagnosis not present

## 2021-05-04 DIAGNOSIS — E7800 Pure hypercholesterolemia, unspecified: Secondary | ICD-10-CM | POA: Diagnosis not present

## 2021-05-18 DIAGNOSIS — H353221 Exudative age-related macular degeneration, left eye, with active choroidal neovascularization: Secondary | ICD-10-CM | POA: Diagnosis not present

## 2021-08-10 DIAGNOSIS — H353231 Exudative age-related macular degeneration, bilateral, with active choroidal neovascularization: Secondary | ICD-10-CM | POA: Diagnosis not present

## 2021-08-17 DIAGNOSIS — H353231 Exudative age-related macular degeneration, bilateral, with active choroidal neovascularization: Secondary | ICD-10-CM | POA: Diagnosis not present

## 2021-09-21 DIAGNOSIS — H353210 Exudative age-related macular degeneration, right eye, stage unspecified: Secondary | ICD-10-CM | POA: Diagnosis not present

## 2021-09-29 DIAGNOSIS — Z6826 Body mass index (BMI) 26.0-26.9, adult: Secondary | ICD-10-CM | POA: Diagnosis not present

## 2021-09-29 DIAGNOSIS — M5432 Sciatica, left side: Secondary | ICD-10-CM | POA: Diagnosis not present

## 2021-10-26 DIAGNOSIS — H353221 Exudative age-related macular degeneration, left eye, with active choroidal neovascularization: Secondary | ICD-10-CM | POA: Diagnosis not present

## 2021-11-02 DIAGNOSIS — H26492 Other secondary cataract, left eye: Secondary | ICD-10-CM | POA: Diagnosis not present

## 2021-11-02 DIAGNOSIS — H353231 Exudative age-related macular degeneration, bilateral, with active choroidal neovascularization: Secondary | ICD-10-CM | POA: Diagnosis not present

## 2021-11-04 DIAGNOSIS — Z6826 Body mass index (BMI) 26.0-26.9, adult: Secondary | ICD-10-CM | POA: Diagnosis not present

## 2021-11-04 DIAGNOSIS — I429 Cardiomyopathy, unspecified: Secondary | ICD-10-CM | POA: Diagnosis not present

## 2021-11-04 DIAGNOSIS — R600 Localized edema: Secondary | ICD-10-CM | POA: Diagnosis not present

## 2021-11-04 DIAGNOSIS — Z8673 Personal history of transient ischemic attack (TIA), and cerebral infarction without residual deficits: Secondary | ICD-10-CM | POA: Diagnosis not present

## 2021-12-21 DIAGNOSIS — H353210 Exudative age-related macular degeneration, right eye, stage unspecified: Secondary | ICD-10-CM | POA: Diagnosis not present

## 2022-01-04 DIAGNOSIS — H353221 Exudative age-related macular degeneration, left eye, with active choroidal neovascularization: Secondary | ICD-10-CM | POA: Diagnosis not present

## 2022-02-03 DIAGNOSIS — S61412A Laceration without foreign body of left hand, initial encounter: Secondary | ICD-10-CM | POA: Diagnosis not present

## 2022-02-06 DIAGNOSIS — Z1331 Encounter for screening for depression: Secondary | ICD-10-CM | POA: Diagnosis not present

## 2022-02-06 DIAGNOSIS — Z9181 History of falling: Secondary | ICD-10-CM | POA: Diagnosis not present

## 2022-02-06 DIAGNOSIS — Z0000 Encounter for general adult medical examination without abnormal findings: Secondary | ICD-10-CM | POA: Diagnosis not present

## 2022-02-15 DIAGNOSIS — H353210 Exudative age-related macular degeneration, right eye, stage unspecified: Secondary | ICD-10-CM | POA: Diagnosis not present

## 2022-03-15 DIAGNOSIS — H353221 Exudative age-related macular degeneration, left eye, with active choroidal neovascularization: Secondary | ICD-10-CM | POA: Diagnosis not present

## 2022-04-26 DIAGNOSIS — H353231 Exudative age-related macular degeneration, bilateral, with active choroidal neovascularization: Secondary | ICD-10-CM | POA: Diagnosis not present

## 2022-05-17 DIAGNOSIS — H353221 Exudative age-related macular degeneration, left eye, with active choroidal neovascularization: Secondary | ICD-10-CM | POA: Diagnosis not present

## 2022-07-05 DIAGNOSIS — H353221 Exudative age-related macular degeneration, left eye, with active choroidal neovascularization: Secondary | ICD-10-CM | POA: Diagnosis not present

## 2022-07-05 DIAGNOSIS — H353210 Exudative age-related macular degeneration, right eye, stage unspecified: Secondary | ICD-10-CM | POA: Diagnosis not present

## 2022-07-12 DIAGNOSIS — H353221 Exudative age-related macular degeneration, left eye, with active choroidal neovascularization: Secondary | ICD-10-CM | POA: Diagnosis not present

## 2022-07-17 DIAGNOSIS — Z23 Encounter for immunization: Secondary | ICD-10-CM | POA: Diagnosis not present

## 2022-09-06 DIAGNOSIS — H353221 Exudative age-related macular degeneration, left eye, with active choroidal neovascularization: Secondary | ICD-10-CM | POA: Diagnosis not present

## 2022-09-20 DIAGNOSIS — H353210 Exudative age-related macular degeneration, right eye, stage unspecified: Secondary | ICD-10-CM | POA: Diagnosis not present

## 2022-11-01 DIAGNOSIS — H353221 Exudative age-related macular degeneration, left eye, with active choroidal neovascularization: Secondary | ICD-10-CM | POA: Diagnosis not present

## 2022-11-06 DIAGNOSIS — Z139 Encounter for screening, unspecified: Secondary | ICD-10-CM | POA: Diagnosis not present

## 2022-11-06 DIAGNOSIS — Z8673 Personal history of transient ischemic attack (TIA), and cerebral infarction without residual deficits: Secondary | ICD-10-CM | POA: Diagnosis not present

## 2022-11-06 DIAGNOSIS — R600 Localized edema: Secondary | ICD-10-CM | POA: Diagnosis not present

## 2022-11-06 DIAGNOSIS — H3530 Unspecified macular degeneration: Secondary | ICD-10-CM | POA: Diagnosis not present

## 2022-11-06 DIAGNOSIS — I429 Cardiomyopathy, unspecified: Secondary | ICD-10-CM | POA: Diagnosis not present

## 2022-11-06 DIAGNOSIS — D696 Thrombocytopenia, unspecified: Secondary | ICD-10-CM | POA: Diagnosis not present

## 2022-12-13 DIAGNOSIS — H353210 Exudative age-related macular degeneration, right eye, stage unspecified: Secondary | ICD-10-CM | POA: Diagnosis not present

## 2022-12-27 DIAGNOSIS — H353221 Exudative age-related macular degeneration, left eye, with active choroidal neovascularization: Secondary | ICD-10-CM | POA: Diagnosis not present

## 2023-02-21 DIAGNOSIS — H353221 Exudative age-related macular degeneration, left eye, with active choroidal neovascularization: Secondary | ICD-10-CM | POA: Diagnosis not present

## 2023-03-14 DIAGNOSIS — H353211 Exudative age-related macular degeneration, right eye, with active choroidal neovascularization: Secondary | ICD-10-CM | POA: Diagnosis not present

## 2023-04-09 DIAGNOSIS — H353221 Exudative age-related macular degeneration, left eye, with active choroidal neovascularization: Secondary | ICD-10-CM | POA: Diagnosis not present

## 2023-04-09 DIAGNOSIS — I6523 Occlusion and stenosis of bilateral carotid arteries: Secondary | ICD-10-CM | POA: Diagnosis not present

## 2023-04-09 DIAGNOSIS — G8929 Other chronic pain: Secondary | ICD-10-CM | POA: Diagnosis not present

## 2023-04-09 DIAGNOSIS — Z87891 Personal history of nicotine dependence: Secondary | ICD-10-CM | POA: Diagnosis not present

## 2023-04-18 DIAGNOSIS — H353221 Exudative age-related macular degeneration, left eye, with active choroidal neovascularization: Secondary | ICD-10-CM | POA: Diagnosis not present

## 2023-06-13 DIAGNOSIS — H353221 Exudative age-related macular degeneration, left eye, with active choroidal neovascularization: Secondary | ICD-10-CM | POA: Diagnosis not present

## 2023-06-20 DIAGNOSIS — H353211 Exudative age-related macular degeneration, right eye, with active choroidal neovascularization: Secondary | ICD-10-CM | POA: Diagnosis not present

## 2023-07-09 DIAGNOSIS — Z23 Encounter for immunization: Secondary | ICD-10-CM | POA: Diagnosis not present

## 2023-08-08 DIAGNOSIS — H353221 Exudative age-related macular degeneration, left eye, with active choroidal neovascularization: Secondary | ICD-10-CM | POA: Diagnosis not present

## 2023-10-03 DIAGNOSIS — H353211 Exudative age-related macular degeneration, right eye, with active choroidal neovascularization: Secondary | ICD-10-CM | POA: Diagnosis not present

## 2023-10-17 DIAGNOSIS — H353221 Exudative age-related macular degeneration, left eye, with active choroidal neovascularization: Secondary | ICD-10-CM | POA: Diagnosis not present

## 2023-12-14 DIAGNOSIS — J309 Allergic rhinitis, unspecified: Secondary | ICD-10-CM | POA: Diagnosis not present

## 2023-12-14 DIAGNOSIS — H1013 Acute atopic conjunctivitis, bilateral: Secondary | ICD-10-CM | POA: Diagnosis not present

## 2023-12-19 DIAGNOSIS — H353221 Exudative age-related macular degeneration, left eye, with active choroidal neovascularization: Secondary | ICD-10-CM | POA: Diagnosis not present

## 2023-12-26 DIAGNOSIS — H353211 Exudative age-related macular degeneration, right eye, with active choroidal neovascularization: Secondary | ICD-10-CM | POA: Diagnosis not present

## 2024-02-20 DIAGNOSIS — H26491 Other secondary cataract, right eye: Secondary | ICD-10-CM | POA: Diagnosis not present

## 2024-02-20 DIAGNOSIS — H353221 Exudative age-related macular degeneration, left eye, with active choroidal neovascularization: Secondary | ICD-10-CM | POA: Diagnosis not present

## 2024-02-20 DIAGNOSIS — H353211 Exudative age-related macular degeneration, right eye, with active choroidal neovascularization: Secondary | ICD-10-CM | POA: Diagnosis not present

## 2024-02-20 DIAGNOSIS — Z961 Presence of intraocular lens: Secondary | ICD-10-CM | POA: Diagnosis not present

## 2024-02-20 DIAGNOSIS — H353231 Exudative age-related macular degeneration, bilateral, with active choroidal neovascularization: Secondary | ICD-10-CM | POA: Diagnosis not present

## 2024-02-27 DIAGNOSIS — H353211 Exudative age-related macular degeneration, right eye, with active choroidal neovascularization: Secondary | ICD-10-CM | POA: Diagnosis not present

## 2024-04-02 DIAGNOSIS — H353211 Exudative age-related macular degeneration, right eye, with active choroidal neovascularization: Secondary | ICD-10-CM | POA: Diagnosis not present

## 2024-04-02 DIAGNOSIS — H353221 Exudative age-related macular degeneration, left eye, with active choroidal neovascularization: Secondary | ICD-10-CM | POA: Diagnosis not present

## 2024-05-14 DIAGNOSIS — H353232 Exudative age-related macular degeneration, bilateral, with inactive choroidal neovascularization: Secondary | ICD-10-CM | POA: Diagnosis not present

## 2024-05-14 DIAGNOSIS — H353221 Exudative age-related macular degeneration, left eye, with active choroidal neovascularization: Secondary | ICD-10-CM | POA: Diagnosis not present

## 2024-06-25 DIAGNOSIS — H353221 Exudative age-related macular degeneration, left eye, with active choroidal neovascularization: Secondary | ICD-10-CM | POA: Diagnosis not present

## 2024-06-25 DIAGNOSIS — H353211 Exudative age-related macular degeneration, right eye, with active choroidal neovascularization: Secondary | ICD-10-CM | POA: Diagnosis not present

## 2024-06-26 DIAGNOSIS — H04223 Epiphora due to insufficient drainage, bilateral lacrimal glands: Secondary | ICD-10-CM | POA: Diagnosis not present

## 2024-06-26 DIAGNOSIS — H353231 Exudative age-related macular degeneration, bilateral, with active choroidal neovascularization: Secondary | ICD-10-CM | POA: Diagnosis not present

## 2024-06-26 DIAGNOSIS — Z961 Presence of intraocular lens: Secondary | ICD-10-CM | POA: Diagnosis not present

## 2024-06-26 DIAGNOSIS — H26491 Other secondary cataract, right eye: Secondary | ICD-10-CM | POA: Diagnosis not present

## 2024-07-08 DIAGNOSIS — Z23 Encounter for immunization: Secondary | ICD-10-CM | POA: Diagnosis not present

## 2024-08-05 DIAGNOSIS — W1839XA Other fall on same level, initial encounter: Secondary | ICD-10-CM | POA: Diagnosis not present

## 2024-08-05 DIAGNOSIS — D696 Thrombocytopenia, unspecified: Secondary | ICD-10-CM | POA: Diagnosis not present

## 2024-08-05 DIAGNOSIS — Z8673 Personal history of transient ischemic attack (TIA), and cerebral infarction without residual deficits: Secondary | ICD-10-CM | POA: Diagnosis not present

## 2024-08-05 DIAGNOSIS — Z79899 Other long term (current) drug therapy: Secondary | ICD-10-CM | POA: Diagnosis not present

## 2024-08-05 DIAGNOSIS — Z87891 Personal history of nicotine dependence: Secondary | ICD-10-CM | POA: Diagnosis not present

## 2024-08-05 DIAGNOSIS — S0101XA Laceration without foreign body of scalp, initial encounter: Secondary | ICD-10-CM | POA: Diagnosis not present

## 2024-08-05 DIAGNOSIS — R918 Other nonspecific abnormal finding of lung field: Secondary | ICD-10-CM | POA: Diagnosis not present

## 2024-08-05 DIAGNOSIS — S0990XA Unspecified injury of head, initial encounter: Secondary | ICD-10-CM | POA: Diagnosis not present

## 2024-08-05 DIAGNOSIS — Z7982 Long term (current) use of aspirin: Secondary | ICD-10-CM | POA: Diagnosis not present

## 2024-08-05 DIAGNOSIS — S0181XA Laceration without foreign body of other part of head, initial encounter: Secondary | ICD-10-CM | POA: Diagnosis not present

## 2024-08-05 DIAGNOSIS — Z043 Encounter for examination and observation following other accident: Secondary | ICD-10-CM | POA: Diagnosis not present
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
## 2024-08-06 DIAGNOSIS — W1839XA Other fall on same level, initial encounter: Secondary | ICD-10-CM | POA: Diagnosis not present

## 2024-08-06 DIAGNOSIS — S0181XA Laceration without foreign body of other part of head, initial encounter: Secondary | ICD-10-CM | POA: Diagnosis not present

## 2024-08-06 NOTE — ED Notes (Signed)
 I supervised care provided by the resident. We have discussed the case, I have reviewed the note and I agree with the plan of treatment.  I personally interviewed the patient and examined the patient.   I have reviewed the nursing notes.  Pertinent labs & imaging results which were available during my care of the patient were reviewed by me. See chart and resident documentation for details.

## 2024-08-12 DIAGNOSIS — S0181XD Laceration without foreign body of other part of head, subsequent encounter: Secondary | ICD-10-CM | POA: Diagnosis not present

## 2024-08-13 DIAGNOSIS — H353231 Exudative age-related macular degeneration, bilateral, with active choroidal neovascularization: Secondary | ICD-10-CM | POA: Diagnosis not present

## 2024-08-19 DIAGNOSIS — S0181XD Laceration without foreign body of other part of head, subsequent encounter: Secondary | ICD-10-CM | POA: Diagnosis not present

## 2024-08-19 DIAGNOSIS — Z9181 History of falling: Secondary | ICD-10-CM | POA: Diagnosis not present

## 2024-08-19 DIAGNOSIS — Z1331 Encounter for screening for depression: Secondary | ICD-10-CM | POA: Diagnosis not present

## 2024-08-19 DIAGNOSIS — Z139 Encounter for screening, unspecified: Secondary | ICD-10-CM | POA: Diagnosis not present

## 2024-09-16 ENCOUNTER — Other Ambulatory Visit: Payer: Self-pay

## 2024-09-16 ENCOUNTER — Emergency Department

## 2024-09-16 ENCOUNTER — Emergency Department
Admission: EM | Admit: 2024-09-16 | Discharge: 2024-09-16 | Disposition: A | Discharge status: Home / Self Care | Attending: Emergency Medicine | Admitting: Emergency Medicine

## 2024-09-16 DIAGNOSIS — Z8673 Personal history of transient ischemic attack (TIA), and cerebral infarction without residual deficits: Secondary | ICD-10-CM | POA: Diagnosis not present

## 2024-09-16 DIAGNOSIS — M25552 Pain in left hip: Secondary | ICD-10-CM | POA: Diagnosis present

## 2024-09-16 DIAGNOSIS — J439 Emphysema, unspecified: Secondary | ICD-10-CM | POA: Insufficient documentation

## 2024-09-16 DIAGNOSIS — M1990 Unspecified osteoarthritis, unspecified site: Secondary | ICD-10-CM | POA: Diagnosis not present

## 2024-09-16 DIAGNOSIS — M1612 Unilateral primary osteoarthritis, left hip: Secondary | ICD-10-CM | POA: Insufficient documentation

## 2024-09-16 DIAGNOSIS — K219 Gastro-esophageal reflux disease without esophagitis: Secondary | ICD-10-CM | POA: Diagnosis not present

## 2024-09-16 DIAGNOSIS — W19XXXA Unspecified fall, initial encounter: Secondary | ICD-10-CM | POA: Diagnosis not present

## 2024-09-16 DIAGNOSIS — R600 Localized edema: Secondary | ICD-10-CM | POA: Diagnosis present

## 2024-09-16 LAB — PRO BRAIN NATRIURETIC PEPTIDE: Pro Brain Natriuretic Peptide: 261 pg/mL

## 2024-09-16 LAB — BASIC METABOLIC PANEL WITH GFR
Anion gap: 8 (ref 5–15)
BUN: 11 mg/dL (ref 8–23)
CO2: 27 mmol/L (ref 22–32)
Calcium: 8.9 mg/dL (ref 8.9–10.3)
Chloride: 104 mmol/L (ref 98–111)
Creatinine, Ser: 0.78 mg/dL (ref 0.61–1.24)
GFR, Estimated: >60 mL/min
Glucose, Bld: 92 mg/dL (ref 70–99)
Potassium: 4.3 mmol/L (ref 3.5–5.1)
Sodium: 139 mmol/L (ref 135–145)

## 2024-09-16 LAB — CBC
HCT: 40.2 % (ref 39.0–52.0)
Hemoglobin: 12.8 g/dL — ABNORMAL LOW (ref 13.0–17.0)
MCH: 30.9 pg (ref 26.0–34.0)
MCHC: 31.8 g/dL (ref 30.0–36.0)
MCV: 97.1 fL (ref 80.0–100.0)
Platelets: 102 K/uL — ABNORMAL LOW (ref 150–400)
RBC: 4.14 MIL/uL — ABNORMAL LOW (ref 4.22–5.81)
RDW: 14.5 % (ref 11.5–15.5)
WBC: 7.1 K/uL (ref 4.0–10.5)
nRBC: 0 % (ref 0.0–0.2)

## 2024-09-16 MED ORDER — ACETAMINOPHEN 500 MG PO TABS
1000.0000 mg | ORAL_TABLET | Freq: Once | ORAL | Status: AC
  Administered 2024-09-16: 1000 mg via ORAL
  Filled 2024-09-16: qty 2

## 2024-09-16 NOTE — Discharge Instructions (Addendum)
 You have been seen in the Emergency Department (ED) today for a fall.  Your work up does not show any concerning injuries.  Please take over-the-counter ibuprofen and/or Tylenol  as needed for your pain (unless you have an allergy or your doctor as told you not to take them), or take any prescribed medication as instructed.  You can ice what hurts for the first 24 hours and then use warm compresses or heating pad after that.  You may pick up and wear a compression stocking on your left leg to help with swelling.  Also elevate your leg multiple times per day.  Please follow up with your doctor (Dr. Leita Bertrand) regarding today's Emergency Department (ED) visit, your left leg swelling, and your recent fall.    Return to the ED if you have any headache, confusion, slurred speech, weakness/numbness of any arm or leg, increased pain, worsening leg swelling, or any other new, worsening or concerning symptoms.

## 2024-09-16 NOTE — ED Provider Notes (Signed)
 "  [facility] Provider Note    Event Date/Time   First MD Initiated Contact with Patient 09/16/24 [phone]     (approximate)   History   Fall   HPI  Jesse Beltran is a 89 y.o. male  with a past medical history of lung emphysema, GERD, hard of hearing, arthritis, acute ischemic CVA presents to the emergency department with his niece for left gluteal pain following a reported mechanical fall that happened on Thanksgiving.  He reports he has not fallen since then.  He lives at home by himself and does independent ADLs. He states he was standing in his house by a chair when he went to turn and his left foot got stuck and he fell backwards onto the floor onto his butt. He reports he did tap the left frontal portion of his head on the dining table. He has been taking 200 to 400 mg of ibuprofen daily for his pain.  He does not take any blood thinners.  He has Lasix listed in his chart, but reports he does not take this medication.  He does ambulate with a cane, has been walking regularly.  He went to breakfast this morning with his son.  No history of cancer. No recent travel.  Does not smoke cigarettes.  He denies dizziness, weakness, headache, LOC, vision changes, chest pain, shortness of breath, abdominal pain, urinary symptoms, knee or ankle pain.    Physical Exam   Triage Vital Signs: ED Triage Vitals [09/16/24 0923]  Encounter Vitals Group     BP (!) 165/103     Girls Systolic BP Percentile      Girls Diastolic BP Percentile      Boys Systolic BP Percentile      Boys Diastolic BP Percentile      Pulse Rate 87     Resp 18     Temp 98 F (36.7 C)     Temp src      SpO2 99 %     Weight 147 lb (66.7 kg)     Height 5' 7 (1.702 m)     Head Circumference      Peak Flow      Pain Score 7     Pain Loc      Pain Education      Exclude from Growth Chart     Most recent vital signs: Vitals:   09/16/24 0923 09/16/24 1020  BP: (!) 165/103 (!) 146/62  Pulse: 87  72  Resp: 18 18  Temp: 98 F (36.7 C) 97.9 F (36.6 C)  SpO2: 99% 100%    General: Awake, in no acute distress. Appears stated age. Head: Normocephalic, atraumatic. No evidence of hematoma. Eyes: PERRLA. EOMs intact.  Ears/Nose/Throat: Nares patent, no nasal discharge. Neck: Supple, no midline cervical tenderness. CV: Good peripheral perfusion. 2+ pitting edema in left lower leg and ankle.  Respiratory:Normal respiratory effort.  No respiratory distress. CTAB. GI: Soft, non-distended, non-tender.  MSK: Normal ROM and  5/5 strength in b/l lower extremities. TTP along left gluteal region.  Skin:Warm, dry, intact.  Neurological: A&Ox4 to person, place, time, and situation. Cranial nerves II-XII grossly intact. Sensation intact. Strength symmetric. No focal deficits.   ED Results / Procedures / Treatments   Labs (all labs ordered are listed, but only abnormal results are displayed) Labs Reviewed  CBC - Abnormal; Notable for the following components:      Result Value   RBC 4.14 (*)  Hemoglobin 12.8 (*)    Platelets 102 (*)    All other components within normal limits  BASIC METABOLIC PANEL WITH GFR  PRO BRAIN NATRIURETIC PEPTIDE     EKG     RADIOLOGY X ray left hip, LLE DVT US  ordered.    PROCEDURES:  Critical Care performed: No   Procedures   MEDICATIONS ORDERED IN ED: Medications  acetaminophen  (TYLENOL ) tablet 1,000 mg (1,000 mg Oral Given 09/16/24 1023)     IMPRESSION / MDM / ASSESSMENT AND PLAN / ED COURSE  I reviewed the triage vital signs and the nursing notes.                              Differential diagnosis includes, but is not limited to, mechanical fall, hip OA, hip strain, contusion, DVT  Patient's presentation is most consistent with acute complicated illness / injury requiring diagnostic workup.   Clinical Course as of 09/16/24 1325  Tue Sep 16, 2024  1031 Case discussed with supervising physician, Dr. Artist Kerns. Patient is an  89 year old male presenting following a fall that occurred on Thanksgiving. He has not had any falls since then.  He reports left gluteal pain since the fall, no other concerns.  Given this was a month ago with no new falls, no LOC or blood thinner usage and patient has GCS 15 with normal neurological exam, I do not feel that a CT head or neck is warranted at this time.  His blood pressure was 165/103 on arrival, recheck is 146/62. He is asymptomatic at this time. X-ray of left hip was ordered in triage.  He does have some left lower leg and ankle swelling, that he is unsure if it is new.  He has Lasix listed in his chart from 2018, but does not take it.  He does not have any other risk factors for a DVT however given his age and the fact he is not on blood thinners with suspected new left lower leg swelling and left gluteal pain, I will check him for a DVT as well as order CBC, CMP and BNP.  He is ambulating on his own with a cane in the room.  He was given 1000 mg of Tylenol . [SD]  1219 Left hip x ray IMPRESSION: 1. No acute osseous abnormality. 2. Mild osteoarthritis of the left hip.  [SD]  1219 BMP and BNP unremarkable. CBC w/ platelets 102, Hgb 12.8. He has a prior platelet count of 95 on 08/05/2024. [SD]  1307 US  LLE IMPRESSION: No evidence of femoropopliteal DVT or superficial thrombophlebitis within the LEFT lower extremity.  [SD]  1319 Reevaluated and pain is improved.  Discussed over-the-counter medication use at home, patient states he will pick these medications up at the store with his niece.  Also discussed RICE.  He can follow-up with his primary care provider following today's visit. [SD]    Clinical Course User Index [SD] Sheron [place], PA-C   The patient may return to the emergency department for any new, worsening, or concerning symptoms. Patient was given the opportunity to ask questions; all questions were answered. Emergency department return precautions were discussed with  the patient.  Patient is in agreement to the treatment plan.  Patient is stable for discharge.   FINAL CLINICAL IMPRESSION(S) / ED DIAGNOSES   Final diagnoses:  Primary osteoarthritis of left hip  Fall, initial encounter     Rx / DC Orders   ED  Discharge Orders     None        Note:  This document was prepared using Dragon voice recognition software and may include unintentional dictation errors.     Sheron [place], PA-C 09/16/24 1326    Bradler, Evan K, MD 09/16/24 1515  "

## 2024-09-16 NOTE — ED Triage Notes (Signed)
 Pt comes with left hip pain. Pt states he did have fall on Thanksgiving. Pt states in the last week it has been hurting. Pt did have another fall here few weeks ago.
